# Patient Record
Sex: Female | Born: 1980
Health system: Southern US, Community
[De-identification: ages and names within clinical notes are randomized; demographics above are authoritative.]

## PROBLEM LIST (undated history)

## (undated) DIAGNOSIS — E079 Disorder of thyroid, unspecified: Secondary | ICD-10-CM

## (undated) DIAGNOSIS — Z8619 Personal history of other infectious and parasitic diseases: Secondary | ICD-10-CM

## (undated) DIAGNOSIS — T7840XA Allergy, unspecified, initial encounter: Secondary | ICD-10-CM

## (undated) DIAGNOSIS — F419 Anxiety disorder, unspecified: Secondary | ICD-10-CM

## (undated) DIAGNOSIS — J45909 Unspecified asthma, uncomplicated: Secondary | ICD-10-CM

## (undated) DIAGNOSIS — K219 Gastro-esophageal reflux disease without esophagitis: Secondary | ICD-10-CM

## (undated) DIAGNOSIS — N390 Urinary tract infection, site not specified: Secondary | ICD-10-CM

## (undated) HISTORY — DX: Urinary tract infection, site not specified: N39.0

## (undated) HISTORY — DX: Disorder of thyroid, unspecified: E07.9

## (undated) HISTORY — DX: Anxiety disorder, unspecified: F41.9

## (undated) HISTORY — DX: Allergy, unspecified, initial encounter: T78.40XA

## (undated) HISTORY — DX: Personal history of other infectious and parasitic diseases: Z86.19

## (undated) HISTORY — PX: TONSILLECTOMY: SUR1361

## (undated) HISTORY — DX: Gastro-esophageal reflux disease without esophagitis: K21.9

## (undated) HISTORY — DX: Unspecified asthma, uncomplicated: J45.909

---

## 2002-03-22 HISTORY — PX: BREAST SURGERY: SHX581

## 2005-03-22 HISTORY — PX: NECK SURGERY: SHX720

## 2010-05-05 ENCOUNTER — Ambulatory Visit (INDEPENDENT_AMBULATORY_CARE_PROVIDER_SITE_OTHER): Payer: BC Managed Care – PPO | Admitting: Family Medicine

## 2010-05-05 ENCOUNTER — Encounter: Payer: Self-pay | Admitting: Family Medicine

## 2010-05-05 DIAGNOSIS — J45990 Exercise induced bronchospasm: Secondary | ICD-10-CM | POA: Insufficient documentation

## 2010-05-05 DIAGNOSIS — Z23 Encounter for immunization: Secondary | ICD-10-CM

## 2010-05-05 DIAGNOSIS — L0390 Cellulitis, unspecified: Secondary | ICD-10-CM | POA: Insufficient documentation

## 2010-05-05 DIAGNOSIS — K219 Gastro-esophageal reflux disease without esophagitis: Secondary | ICD-10-CM | POA: Insufficient documentation

## 2010-05-05 DIAGNOSIS — IMO0002 Reserved for concepts with insufficient information to code with codable children: Secondary | ICD-10-CM | POA: Insufficient documentation

## 2010-05-05 DIAGNOSIS — F410 Panic disorder [episodic paroxysmal anxiety] without agoraphobia: Secondary | ICD-10-CM | POA: Insufficient documentation

## 2010-05-05 DIAGNOSIS — J301 Allergic rhinitis due to pollen: Secondary | ICD-10-CM | POA: Insufficient documentation

## 2010-05-05 DIAGNOSIS — L0291 Cutaneous abscess, unspecified: Secondary | ICD-10-CM | POA: Insufficient documentation

## 2010-05-05 DIAGNOSIS — N941 Unspecified dyspareunia: Secondary | ICD-10-CM | POA: Insufficient documentation

## 2010-05-12 ENCOUNTER — Other Ambulatory Visit: Payer: BC Managed Care – PPO

## 2010-05-13 NOTE — Assessment & Plan Note (Signed)
Summary: new pt to est jrt r/s from 04/30/10   Vital Signs:  Patient profile:   30 year old female Height:      64 inches Weight:      130 pounds BMI:     22.40 Temp:     97.9 degrees F oral Pulse rate:   71 / minute Pulse rhythm:   regular BP sitting:   96 / 72  (left arm) Cuff size:   regular  Vitals Entered By: Linde Gillis CMA Duncan Dull) (May 05, 2010 1:59 PM) CC: new patient, establish care   History of Present Illness: Here to establish.  Working at [employer].  Has not had a regualr MD in a while.. due for CPX.   GERD, diet controlled.  Typical pain during intercourse, worse near menses. In past some episodes of pelvic pain. None now. Left abdominal tenderness. Told once there was a concern for endometriosis. Had nml pelvic US. HAs had bad response to OCPs in the past.  Interested in having IUD.Marland Kitchen? interested in referral to GYN  Left hip tenderness  ...overuse from running.Malvin Johns ORHTO at [facility]. X-ray of hip nml.  Recommended  NSAIDs, rest and PT.   Pain is better but not reolved. Running on even surface. HAs some leg length discrepency.   Preventive Screening-Counseling & Management  Alcohol-Tobacco     Smoking Status: quit  Caffeine-Diet-Exercise     Does Patient Exercise: yes      Drug Use:  no.    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 Married No children. Former Smoker 3-4 pack year history Alcohol use-yes 1-2 drinks a night. Regular exercise-yes, running, weights, yoga Drug use-no Diet: Vegetarian.. eats a lot of protein  limits sweets Occupation:  employed Smoking Status:  quit Does Patient Exercise:  yes Drug Use:   no  Review of Systems General:  Complains of fatigue; mild fatigue.. family history of thyroid issues. CV:  Denies chest pain or discomfort. Resp:  Denies shortness of breath. GI:  Complains of abdominal pain; denies bloody stools, constipation, diarrhea, and indigestion. GU:  Denies abnormal vaginal bleeding, discharge, and dysuria. Derm:  Denies lesion(s). Psych:  Denies anxiety and depression.  Physical Exam  General:  thin appearing female IN NAD  Eyes:  No corneal or conjunctival inflammation noted. EOMI. Perrla. Funduscopic exam benign, without hemorrhages, exudates or papilledema. Vision grossly normal. Ears:  External ear exam shows no significant lesions or deformities.  Otoscopic examination reveals clear canals, tympanic membranes are intact bilaterally without bulging, retraction, inflammation or discharge. Hearing is grossly normal bilaterally. Nose:  External nasal examination shows no deformity or inflammation. Nasal mucosa are pink and moist without lesions or exudates. Mouth:  Oral mucosa and oropharynx without lesions or exudates.  Teeth in good repair. Neck:  no carotid bruit or thyromegaly no cervical or supraclavicular lymphadenopathy  Lungs:  Normal respiratory effort, chest expands symmetrically. Lungs are clear to auscultation, no crackles or wheezes. Heart:  Normal rate and regular rhythm. S1 and S2 normal without gallop, murmur, click, rub or other extra sounds. Abdomen:  Bowel sounds positive,abdomen soft and non-tender without masses, organomegaly or hernias noted. Msk:  No deformity or scoliosis noted of thoracic or lumbar spine.  Pulses:  R and L posterior tibial pulses are full and equal bilaterally  Extremities:  no edmea  Skin:  Intact without suspicious lesions or rashes Psych:  Cognition and judgment appear intact. Alert and cooperative with normal attention span and concentration. No apparent delusions, illusions, hallucinations   Impression &  Recommendations:  Problem # 1:  DYSPAREUNIA (ICD-625.0) Previous GYN, thought ? endometriosis. Nml [place] in past. No current pain. Pt also needs PAP and Breast ecam and interested in IUD.  Refer to GYN for above.  Orders: Gynecologic Referral (Gyn)  Problem # 2:  GERD (ICD-530.81) Well controlled with diet.   Complete Medication List: 1)  Proair Hfa 108 (90 Base) Mcg/act Aers (Albuterol sulfate) .... 2 puffs inhaled q 4-6 hours  as needed wheeze  Other Orders: Tdap => 74yrs IM (40981) Admin 1st Vaccine (19147)  Patient Instructions: 1)  Fasting lipids, CMET Dx v77.91, TSHDx 780.79 2)  Referral Appointment Information 3)  Day/Date: 4)  Time: 5)  Place/MD: 6)  Address: 7)  Phone/Fax: 8)  Patient given appointment information. Information/Orders faxed/mailed.   Orders Added: 1)  Tdap => 27yrs IM [90715] 2)  Admin 1st Vaccine [90471] 3)  Gynecologic Referral [Gyn] 4)  New Patient Level II [82956]   Immunizations Administered:  Tetanus Vaccine:    Vaccine Type: Tdap   Immunizations Administered:  Tetanus Vaccine:    Vaccine Type: Tdap  Prior Medications (reviewed today): None Current Allergies (reviewed today): No known allergies   Appended Document: new pt to est jrt r/s from 04/30/10     Allergies: No Known Drug Allergies   Complete Medication List: 1)  Proair Hfa 108 (90 Base) Mcg/act Aers (Albuterol sulfate) .... 2 puffs inhaled q 4-6 hours  as needed wheeze  Other Orders: Tdap => 56yrs IM (21308) Admin 1st Vaccine (65784)   Orders Added: 1)  Tdap => 71yrs IM [90715] 2)  Admin 1st Vaccine [69629]   Immunizations Administered:  Tetanus Vaccine:    Vaccine Type: Tdap    Site: left deltoid    Mfr: GlaxoSmithKline    Dose: 0.5 ml    Route: IM    Given by: Linde Gillis CMA (AAMA)    Exp. Date: 01/09/2012    Lot #: [id number]    VIS given: 02/07/08 version given May 05, 2010.   Immunizations Administered:  Tetanus Vaccine:    Vaccine  Type: Tdap    Site: left deltoid    Mfr: GlaxoSmithKline    Dose: 0.5 ml    Route: IM    Given by: Linde Gillis CMA (AAMA)    Exp. Date: 01/09/2012    Lot #: [id number]    VIS given: 02/07/08 version given May 05, 2010.

## 2010-05-14 ENCOUNTER — Other Ambulatory Visit (INDEPENDENT_AMBULATORY_CARE_PROVIDER_SITE_OTHER): Payer: BC Managed Care – PPO

## 2010-05-14 ENCOUNTER — Other Ambulatory Visit: Payer: Self-pay | Admitting: Family Medicine

## 2010-05-14 ENCOUNTER — Encounter (INDEPENDENT_AMBULATORY_CARE_PROVIDER_SITE_OTHER): Payer: Self-pay | Admitting: *Deleted

## 2010-05-14 DIAGNOSIS — R5383 Other fatigue: Secondary | ICD-10-CM

## 2010-05-14 DIAGNOSIS — Z13220 Encounter for screening for lipoid disorders: Secondary | ICD-10-CM

## 2010-05-14 DIAGNOSIS — R5381 Other malaise: Secondary | ICD-10-CM

## 2010-05-14 LAB — HEPATIC FUNCTION PANEL
ALT: 16 U/L (ref 0–35)
AST: 26 U/L (ref 0–37)
Alkaline Phosphatase: 44 U/L (ref 39–117)
Bilirubin, Direct: 0.1 mg/dL (ref 0.0–0.3)
Total Bilirubin: 0.6 mg/dL (ref 0.3–1.2)
Total Protein: 6.3 g/dL (ref 6.0–8.3)

## 2010-05-14 LAB — BASIC METABOLIC PANEL
CO2: 28 mEq/L (ref 19–32)
Chloride: 105 mEq/L (ref 96–112)
Creatinine, Ser: 0.7 mg/dL (ref 0.4–1.2)
Potassium: 4.2 mEq/L (ref 3.5–5.1)

## 2010-05-14 LAB — LIPID PANEL
LDL Cholesterol: 91 mg/dL (ref 0–99)
Total CHOL/HDL Ratio: 2
Triglycerides: 62 mg/dL (ref 0.0–149.0)

## 2010-06-02 ENCOUNTER — Ambulatory Visit: Payer: BC Managed Care – PPO | Admitting: Obstetrics and Gynecology

## 2010-06-02 DIAGNOSIS — Z1272 Encounter for screening for malignant neoplasm of vagina: Secondary | ICD-10-CM

## 2010-06-02 DIAGNOSIS — Z01419 Encounter for gynecological examination (general) (routine) without abnormal findings: Secondary | ICD-10-CM

## 2010-06-25 NOTE — Assessment & Plan Note (Signed)
NAME:  Tonya Carrillo, Tonya Carrillo NO.:  [id number]  MEDICAL RECORD NO.:  [id number]           PATIENT TYPE:  LOCATION:  [facility]           FACILITY:  PHYSICIAN:  Allie Bossier, MD             DATE OF BIRTH:  DATE OF SERVICE:  06/02/2010                                 CLINIC NOTE  HISTORY OF PRESENT ILLNESS:  Female is a 30 year old married white gravida 0 who is interested in long-term birth control.  She is "99% sure" that she wants no children, but she is not ready to commit to a tubal.  She has no GYN complaints.  PAST MEDICAL HISTORY:  She has a history of MRSA.  REVIEW OF SYSTEMS:  She uses condoms currently for birth control.  She tried OCPs (Ortho-Tri-Cyclen Lo), but she ended up having panic attacks for several months after a while and slightly after being on Ortho Tri- Cyclen Lo, so she is adamant that she wants no systemic hormonal birth control.  Her periods are monthly and they last 3-5 days.  She has been married for last 2-1/2 years.  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  She is a strict vegetarian.  PAST SURGICAL HISTORY:  Tonsillectomy, wisdom teeth extraction, sebaceous cyst removed from her breast (right breast) and another from her neck.  FAMILY HISTORY:  Positive for breast cancer in her maternal grandmother who was diagnosed in her late 91s.  Her mother has hypothyroidism.  She denies GYN or colon malignancies in her family.  ALLERGIES:  No known drug allergies.  No latex allergies.  SOCIAL HISTORY:  She drinks on a social basis.  She quit smoking in 2009 and prior to that had a 12-pack-year history of smoking.  She denies any drug use.  MEDICATIONS:  None.  PHYSICAL EXAMINATION:  GENERAL:  Well-nourished, well-hydrated very pleasant white female. VITAL SIGNS:  Height 5 feet 4-1/2 inches, weight 134 pounds, blood pressure 114/70, pulse 56. HEENT:  Normal breast exam.  Normal bilaterally.  No nipple  discharge, skin changes, or masses. ABDOMEN:  No palpable hepatosplenomegaly. EXTERNAL GENITALIA:  No lesions.  Cervix tiny and nulliparous.  Uterus normal size and shape, anteverted, midplane, mobile.  Adnexa nontender. No masses.  ASSESSMENT/PLAN: 1. Annual exam.  I have checked Pap smear.  Recommended self-breast     and self-vulvar exams monthly. 2. Birth control.  I have discussed long-term options including the     risks and benefits of Mirena and ParaGard.  I have also discussed     hysteroscopic tubal ligation (Essure) as well as vasectomy.  She     will do further research     and contemplation and will schedule point when she makes a decision     about this.  Until then, she will use condoms.     Allie Bossier, MD    MCD/MEDQ  D:  06/02/2010  T:  06/03/2010  Job:  [id number]

## 2010-06-29 ENCOUNTER — Encounter: Payer: Self-pay | Admitting: Family Medicine

## 2010-06-30 ENCOUNTER — Encounter: Payer: Self-pay | Admitting: Family Medicine

## 2010-06-30 ENCOUNTER — Ambulatory Visit (INDEPENDENT_AMBULATORY_CARE_PROVIDER_SITE_OTHER): Payer: Self-pay | Admitting: Family Medicine

## 2010-06-30 VITALS — BP 118/80 | HR 54 | Temp 98.5°F | Ht 64.5 in | Wt 134.0 lb

## 2010-06-30 DIAGNOSIS — N390 Urinary tract infection, site not specified: Secondary | ICD-10-CM

## 2010-06-30 DIAGNOSIS — R300 Dysuria: Secondary | ICD-10-CM

## 2010-06-30 LAB — POCT URINALYSIS DIPSTICK
Bilirubin, UA: NEGATIVE
Blood, UA: NEGATIVE
Ketones, UA: NEGATIVE
Protein, UA: NEGATIVE
Spec Grav, UA: 1
pH, UA: 6.5

## 2010-06-30 MED ORDER — NITROFURANTOIN MONOHYD MACRO 100 MG PO CAPS: 100.0000 mg | ORAL_CAPSULE | Freq: Two times a day (BID) | ORAL | Status: AC

## 2010-06-30 MED ORDER — NITROFURANTOIN MONOHYD MACRO 100 MG PO CAPS: 100.0000 mg | ORAL_CAPSULE | Freq: Two times a day (BID) | ORAL | Status: DC

## 2010-06-30 NOTE — Progress Notes (Signed)
30 year old female:  A little blood on the tissue.  LMP 06/19/2010  Overnight got up a lot  Burned some.   Patient presents with burning, urgency. No vaginal discharge or external irritation.  No STD exposure. No abd pain, no flank pain.  ROS: GEN:  no fevers, chills. GI: No n/v/d, eating normally Otherwise, ROS is as per the HPI.  GEN: WDWN, A&Ox4,NAD. Non-toxic HEENT: Atraumatc, normocephalic. CV: RRR, No M/G/R PULM: CTA B, No wheezes, crackles, or rhonchi ABD: S, NT, ND, +BS, no rebound. No CVAT. No suprapubic tenderness. EXT: No c/c/e  A/P: UTI. Rx with ABX as below - probable. H/o bladder stones, may have been a passing stone. Hold abx for a few days to see if sx clear

## 2011-07-09 ENCOUNTER — Ambulatory Visit (INDEPENDENT_AMBULATORY_CARE_PROVIDER_SITE_OTHER): Payer: BC Managed Care – PPO | Admitting: Family Medicine

## 2011-07-09 ENCOUNTER — Encounter: Payer: Self-pay | Admitting: Family Medicine

## 2011-07-09 VITALS — BP 100/60 | HR 85 | Temp 98.2°F | Ht 64.5 in | Wt 136.0 lb

## 2011-07-09 DIAGNOSIS — J069 Acute upper respiratory infection, unspecified: Secondary | ICD-10-CM

## 2011-07-09 DIAGNOSIS — J029 Acute pharyngitis, unspecified: Secondary | ICD-10-CM

## 2011-07-09 DIAGNOSIS — B309 Viral conjunctivitis, unspecified: Secondary | ICD-10-CM

## 2011-07-09 NOTE — Progress Notes (Signed)
Subjective:    Patient ID: Tonya Carrillo, female    DOB: 1980-09-29, 31 y.o.   MRN: [id number]  HPI Here for st and conjunctivitis  Run down and tired with stress lately Taking claritin for allergies   Woke up wed with swollen feeling throat - hard to swallow and sore  Thought it was allergies  As day went on -got chills and felt feverish  By ThursdayLinden Dolin - felt like the flu  Did not take her temp   Then gunk in her L eye -- lots of cloudy/ yellow discharge (took out her contacts)  Eye is mildly itchy overall   Using saline drops in eye   Rapid strep neg today   Is having a little runny and stuffy nose   Patient Active Problem List  Diagnoses  . PANIC ATTACK  . ALLERGIC RHINITIS, SEASONAL  . ASTHMA, EXERCISE INDUCED  . GERD  . DYSPAREUNIA  . OTHER MALAISE AND FATIGUE  . UTI (urinary tract infection)  . Viral URI  . Conjunctivitis, viral   No past medical history on file. Past Surgical History  Procedure Date  . Tonsillectomy    History  Substance Use Topics  . Smoking status: Former Smoker -- 1.0 packs/day for 3 years    Types: Cigarettes  . Smokeless tobacco: Not on file  . Alcohol Use: Yes   Family History  Problem Relation Age of Onset  . Stroke Mother   . Hypertension Mother   . Hyperlipidemia Mother   . Thyroid disease Mother   . Cancer Father     multiple myelomas  . Obesity Brother   . Cancer Maternal Grandmother     breast  . Cancer Maternal Grandfather 57    colon   . ALS Paternal Grandfather    No Known Allergies Current Outpatient Prescriptions on File Prior to Visit  Medication Sig Dispense Refill  . albuterol (PROAIR HFA) 108 (90 BASE) MCG/ACT inhaler Inhale 2 puffs into the lungs every 6 (six) hours as needed.               Review of Systems Review of Systems  Constitutional: Negative for, appetite change,  and unexpected weight change.  Eyes: Negative for pain and visual disturbance. pos for redness of eye and  itching ENt pos for runny and stuffy nose with post nasal drip , neg for sinus pain  Respiratory: Negative forsob or wheeze   Cardiovascular: Negative for cp or palpitations    Gastrointestinal: Negative for nausea, diarrhea and constipation.  Genitourinary: Negative for urgency and frequency.  Skin: Negative for pallor or rash   Neurological: Negative for weakness, light-headedness, numbness and headaches.  Hematological: Negative for adenopathy. Does not bruise/bleed easily.  Psychiatric/Behavioral: Negative for dysphoric mood. The patient is not nervous/anxious.          Objective:   Physical Exam  Constitutional: She appears well-nourished. No distress.  HENT:  Head: Normocephalic and atraumatic.  Right Ear: External ear normal.  Left Ear: External ear normal.  Mouth/Throat: Oropharynx is clear and moist. No oropharyngeal exudate.       Nares are injected and congested  No sinus tenderness L eye - is injected with some clear tearing , no s/s of trauma or fb seen  Eyes: EOM are normal. Pupils are equal, round, and reactive to light. Right eye exhibits no discharge. Left eye exhibits discharge. No scleral icterus.  Neck: Normal range of motion. Neck supple.  Cardiovascular: Normal rate, regular rhythm and normal  heart sounds.   No murmur heard. Pulmonary/Chest: Effort normal and breath sounds normal. No respiratory distress. She has no wheezes. She has no rales.  Lymphadenopathy:    She has no cervical adenopathy.  Neurological: She is alert.  Skin: Skin is warm and dry. No rash noted. No erythema.  Psychiatric: She has a normal mood and affect.          Assessment & Plan:

## 2011-07-09 NOTE — Patient Instructions (Signed)
Drink lots of fluids Tylenol for fever or throat pain or ibuprofen  mucinex for congestion or cough is helpful  Use artificial tears for eye - wipe with cool clean cloth for discharge , cool compresses as needed  No contacts until completely better  claritin ok for runny nose  Update if not starting to improve in a week or if worsening   Update me if eye gets much worse quickly - red or painful or vision change  Wash all linens frequently

## 2011-07-11 NOTE — Assessment & Plan Note (Signed)
With congestion and L conjunctivitis Disc symptomatic care - see instructions on AVS  Update if not starting to improve in a week or if worsening

## 2011-07-11 NOTE — Assessment & Plan Note (Signed)
Mild in L eye  Assoc with viral uri  Disc symptomatic care - see instructions on AVS  Disc imp of hygiene  Update if not starting to improve in a week or if worsening

## 2011-09-01 ENCOUNTER — Encounter: Payer: Self-pay | Admitting: Family Medicine

## 2011-09-01 ENCOUNTER — Ambulatory Visit (INDEPENDENT_AMBULATORY_CARE_PROVIDER_SITE_OTHER): Payer: BC Managed Care – PPO | Admitting: Family Medicine

## 2011-09-01 VITALS — BP 98/64 | HR 56 | Temp 98.2°F | Ht 64.5 in | Wt 137.8 lb

## 2011-09-01 DIAGNOSIS — R5381 Other malaise: Secondary | ICD-10-CM

## 2011-09-01 DIAGNOSIS — D179 Benign lipomatous neoplasm, unspecified: Secondary | ICD-10-CM

## 2011-09-01 DIAGNOSIS — R5383 Other fatigue: Secondary | ICD-10-CM | POA: Insufficient documentation

## 2011-09-01 LAB — CBC WITH DIFFERENTIAL/PLATELET
Eosinophils Relative: 2.8 % (ref 0.0–5.0)
HCT: 41.7 % (ref 36.0–46.0)
Hemoglobin: 14 g/dL (ref 12.0–15.0)
Lymphs Abs: 2.1 10*3/uL (ref 0.7–4.0)
Monocytes Relative: 10.6 % (ref 3.0–12.0)
Neutro Abs: 3.4 10*3/uL (ref 1.4–7.7)
WBC: 6.3 10*3/uL (ref 4.5–10.5)

## 2011-09-01 LAB — COMPREHENSIVE METABOLIC PANEL
CO2: 27 mEq/L (ref 19–32)
Creatinine, Ser: 0.8 mg/dL (ref 0.4–1.2)
GFR: 95.02 mL/min (ref >60.00)
Glucose, Bld: 83 mg/dL (ref 70–99)
Total Bilirubin: 0.3 mg/dL (ref 0.3–1.2)

## 2011-09-01 LAB — POCT URINE PREGNANCY: Preg Test, Ur: NEGATIVE

## 2011-09-01 LAB — TSH: TSH: 1.73 u[IU]/mL (ref 0.35–5.50)

## 2011-09-01 NOTE — Patient Instructions (Addendum)
Labs today for fatigue  Keep up the good diet and exercise and sleep habits I think the area on buttocks is a lipoma - watch it for growth or pain and let me know if any change at all  preg test today also

## 2011-09-01 NOTE — Progress Notes (Signed)
Subjective:    Patient ID: Tonya Carrillo, female    DOB: 06/15/1980, 31 y.o.   MRN: [id number]  HPI Here for fatigue and lump R buttocks   Has had a stressful academic year - thought that was causing fatigue  Is worse now after being out for 3 weeks  Sleeps ok  Exercises/ gets enough sleep/ good health habits  Is having more difficulty loosing weight - even though she tracks everything and exercises  Instead is gaining  bmi is 23  Weird tingling in arms and leg -- fleeting at times- no pain or other symptoms  Does have a hx of panic attacks once with OCs - and that was purely caused by the pill Better now  Has good insight of mood -- always fluctuates after school year   Right now is reading and writing  Teaches classics - English   Periods - a little irreg - April and may were a bit late - and heavier Also pain mid cycle  Due for menses now  Doubts pregnant - uses condoms   Has a lump under skin R buttock  Does not hurt- no red / itchy or draining  ? A lipoma  Is a vegetarian  Lots of dark green vegetables   Patient Active Problem List  Diagnosis  . PANIC ATTACK  . ALLERGIC RHINITIS, SEASONAL  . ASTHMA, EXERCISE INDUCED  . GERD  . DYSPAREUNIA  . OTHER MALAISE AND FATIGUE  . UTI (urinary tract infection)  . Viral URI  . Conjunctivitis, viral   No past medical history on file. Past Surgical History  Procedure Date  . Tonsillectomy    History  Substance Use Topics  . Smoking status: Former Smoker -- 1.0 packs/day for 3 years    Types: Cigarettes  . Smokeless tobacco: Never Used  . Alcohol Use: Yes     wine or beer nightly   Family History  Problem Relation Age of Onset  . Stroke Mother   . Hypertension Mother   . Hyperlipidemia Mother   . Thyroid disease Mother   . Cancer Father     multiple myelomas  . Obesity Brother   . Cancer Maternal Grandmother     breast  . Cancer Maternal Grandfather 55    colon   . ALS Paternal Grandfather    No  Known Allergies Current Outpatient Prescriptions on File Prior to Visit  Medication Sig Dispense Refill  . albuterol (PROAIR HFA) 108 (90 BASE) MCG/ACT inhaler Inhale 2 puffs into the lungs every 6 (six) hours as needed.        . loratadine (CLARITIN) 10 MG tablet Take 10 mg by mouth daily as needed.              Review of Systems Review of Systems  Constitutional: Negative for fever, appetite change, and unexpected weight change.  Eyes: Negative for pain and visual disturbance.  Respiratory: Negative for cough and shortness of breath.   Cardiovascular: Negative for cp or palpitations    Gastrointestinal: Negative for nausea, diarrhea and constipation.  Genitourinary: Negative for urgency and frequency.  Skin: Negative for pallor or rash   Neurological: Negative for weakness, light-headedness, numbness and headaches.  Hematological: Negative for adenopathy. Does not bruise/bleed easily.  Psychiatric/Behavioral: Negative for dysphoric mood.anxiety is improved with less stress       Objective:   Physical Exam  Constitutional: She appears well-developed and well-nourished. No distress.  HENT:  Head: Normocephalic and atraumatic.  Right Ear: External  ear normal.  Left Ear: External ear normal.  Nose: Nose normal.  Mouth/Throat: Oropharynx is clear and moist.  Eyes: Conjunctivae and EOM are normal. No scleral icterus.  Neck: Normal range of motion. Neck supple. No JVD present. No thyromegaly present.  Cardiovascular: Normal rate, regular rhythm, normal heart sounds and intact distal pulses.   Pulmonary/Chest: Breath sounds normal. No respiratory distress. She has no wheezes.  Abdominal: Soft. Bowel sounds are normal. She exhibits no distension and no mass. There is no tenderness.  Musculoskeletal: Normal range of motion. She exhibits no edema and no tenderness.  Lymphadenopathy:    She has no cervical adenopathy.  Neurological: She is alert. She has normal reflexes. No cranial  nerve deficit. She exhibits normal muscle tone. Coordination normal.  Skin: Skin is warm and dry. No rash noted. No erythema. No pallor.       R buttock has superficial mobile fatty lump that is non tender with no skin change   Psychiatric: She has a normal mood and affect.       Not anxious but seem stressed           Assessment & Plan:  l

## 2011-09-04 NOTE — Assessment & Plan Note (Signed)
With recent stressors No other physical symptoms  Lab today and update  Good health and self care habits discussed No signs of depression - but will be on the look out for that

## 2011-09-04 NOTE — Assessment & Plan Note (Signed)
On R buttock with no pain or other symptoms Will observe this for growth or other change Consider surgical ref if change occurs

## 2011-12-28 ENCOUNTER — Encounter: Payer: Self-pay | Admitting: Family Medicine

## 2011-12-28 ENCOUNTER — Ambulatory Visit (INDEPENDENT_AMBULATORY_CARE_PROVIDER_SITE_OTHER): Payer: BC Managed Care – PPO | Admitting: Family Medicine

## 2011-12-28 VITALS — BP 105/69 | HR 59 | Ht 64.5 in | Wt 124.1 lb

## 2011-12-28 DIAGNOSIS — S3141XA Laceration without foreign body of vagina and vulva, initial encounter: Secondary | ICD-10-CM

## 2011-12-28 DIAGNOSIS — S3140XA Unspecified open wound of vagina and vulva, initial encounter: Secondary | ICD-10-CM

## 2011-12-28 NOTE — Progress Notes (Signed)
  Subjective:    Patient ID: Tonya Carrillo, female    DOB: 24-May-1980, 31 y.o.   MRN: [id number]  HPI  Pt. Is here for bleeding following fingers with sexual activity.  LNMP was 9/16.  Bleeding lasted one day and was bright red.  Thinks it could be a laceration. New partner.  Uses condoms.  No vaginal discharge.  Review of Systems  Constitutional: Negative for fever and fatigue.  Gastrointestinal: Negative for abdominal pain.  Genitourinary: Negative for dysuria and vaginal discharge.       Objective:   Physical Exam  Genitourinary: There is no tenderness or lesion on the right labia. There is no tenderness or lesion on the left labia. Uterus is not tender. Cervix exhibits no motion tenderness and no friability. Right adnexum displays no mass and no tenderness. Left adnexum displays no mass and no tenderness. There are signs of injury (left side wall) around the vagina. No vaginal discharge found.          Assessment & Plan:  Vaginal laceration--should heal with time.  Avoid anything per vagina for a few days.

## 2011-12-28 NOTE — Patient Instructions (Signed)
Vaginal Laceration  You have a laceration of the vagina. A laceration is a cut or tear. Small tears usually do not need stitches. Your caregiver may have to repair the tear with stitches for you. This brings the skin edges (margins) together and allows the cut to heal faster. Your caregiver will instruct you as to whether stitch (suture) removal is necessary. Some sutures are absorbed by the body without removal. Some vaginal lacerations can be very serious because of heavy bleeding. Therefore, medical evaluation is necessary right away.  CAUSES    A controlled cutting of the vagina when delivering a baby (episiotomy).   Delivering a baby without an episiotomy.   Trauma caused by a bicycle or car accident.   Domestic violence and/or rape.  HOME CARE INSTRUCTIONS    Only take over-the-counter or prescription medicines for pain, discomfort, or fever as directed by your caregiver. Do not use aspirin because it can increase bleeding problems.   Your caregiver may prescribe medicines that kill germs (antibiotics) or recommend a tetanus shot.   You may resume normal diet and activities as directed.   Do not douche, use tampons or have intercourse until your caregiver has given permission.   A bandage (dressing) may have been applied. This may be changed once per day or as instructed. If the dressing sticks, it may be soaked off with soapy water or hydrogen peroxide.   Warm sitz baths 2 to 3 times a day may help any discomfort and swelling of the laceration.  You might need a tetanus shot now if:   You have no idea when you had the last one.   You have never had a tetanus shot before.   Your cut had dirt in it.  If you need a tetanus shot, and you decide not to get one, there is a rare chance of getting tetanus. Sickness from tetanus can be serious. If you got a tetanus shot, your arm may swell, get red and warm to the touch at the shot site. This is common and not a problem.  SEEK MEDICAL CARE IF:    You  develop a rash.   You have problems with the medications.  SEEK IMMEDIATE MEDICAL CARE IF:    There is redness, swelling, or increasing pain in the vaginal area.   Pus is coming from the wound or vagina.   An unexplained oral temperature above 102 F (38.9 C) develops.   You notice a bad smell coming from the vagina. This may be a sign of infection.   There is a breaking open of a wound (edges not staying together).   You develop increasing belly (abdominal) pain.   There is a lot of (excessive) vaginal bleeding.   There is pain with intercourse after the laceration heals. Scar tissue may have developed.  Document Released: 03/08/2005 Document Revised: 05/31/2011 Document Reviewed: 07/26/2011  [employer] Patient Information 2013 [employer]

## 2011-12-28 NOTE — Progress Notes (Signed)
Pelvic pain, worse at night when lying down.  Did have some vaginal bleeding 10/3-4.

## 2013-02-28 ENCOUNTER — Ambulatory Visit (INDEPENDENT_AMBULATORY_CARE_PROVIDER_SITE_OTHER): Payer: BC Managed Care – PPO | Admitting: Obstetrics & Gynecology

## 2013-02-28 ENCOUNTER — Encounter: Payer: Self-pay | Admitting: Obstetrics & Gynecology

## 2013-02-28 VITALS — BP 125/94 | HR 59 | Ht 64.0 in | Wt 122.0 lb

## 2013-02-28 DIAGNOSIS — Z113 Encounter for screening for infections with a predominantly sexual mode of transmission: Secondary | ICD-10-CM

## 2013-02-28 DIAGNOSIS — Z1151 Encounter for screening for human papillomavirus (HPV): Secondary | ICD-10-CM

## 2013-02-28 DIAGNOSIS — J45909 Unspecified asthma, uncomplicated: Secondary | ICD-10-CM

## 2013-02-28 DIAGNOSIS — Z01419 Encounter for gynecological examination (general) (routine) without abnormal findings: Secondary | ICD-10-CM

## 2013-02-28 DIAGNOSIS — F410 Panic disorder [episodic paroxysmal anxiety] without agoraphobia: Secondary | ICD-10-CM

## 2013-02-28 DIAGNOSIS — Z124 Encounter for screening for malignant neoplasm of cervix: Secondary | ICD-10-CM

## 2013-02-28 MED ORDER — LORAZEPAM 0.5 MG PO TABS: 0.2500 mg | ORAL_TABLET | Freq: Two times a day (BID) | ORAL | Status: DC | PRN

## 2013-02-28 MED ORDER — ALBUTEROL SULFATE HFA 108 (90 BASE) MCG/ACT IN AERS: 2.0000 | INHALATION_SPRAY | Freq: Four times a day (QID) | RESPIRATORY_TRACT | Status: DC | PRN

## 2013-02-28 NOTE — Progress Notes (Signed)
  Subjective:     Tonya Carrillo is a 32 y.o. G42P1001 female and is here for a comprehensive physical exam. The patient reports no problems. Desires STI screen.  History   Social History  . Marital Status: Married    Spouse Name: N/A    Number of Children: 0  . Years of Education: N/A   Occupational History  . [employer] professor    Social History Main Topics  . Smoking status: Former Smoker -- 1.00 packs/day for 3 years    Types: Cigarettes  . Smokeless tobacco: Never Used  . Alcohol Use: Yes     Comment: wine or beer nightly  . Drug Use: No  . Sexual Activity: Yes    Birth Control/ Protection: Condom   Other Topics Concern  . Not on file   Social History Narrative   Diet Vegetarian.. Eats a lot of protein limits sweets      Regular exercise running, weights, yoga   Health Maintenance  Topic Date Due  . Pap Smear  05/28/1997  . Influenza Vaccine  10/20/2012  . Tetanus/tdap  05/05/2020    The following portions of the patient's history were reviewed and updated as appropriate: allergies, current medications, past family history, past medical history, past social history, past surgical history and problem list.  Review of Systems A comprehensive review of systems was negative.   Objective:   BP 125/94  Pulse 59  Ht 5\' 4"  (1.626 m)  Wt 122 lb (55.339 kg)  BMI 20.93 kg/m2  LMP 02/20/2013 GENERAL: Well-developed, well-nourished female in no acute distress.  HEENT: Normocephalic, atraumatic. Sclerae anicteric.  NECK: Supple. Normal thyroid.  LUNGS: Clear to auscultation bilaterally.  HEART: Regular rate and rhythm. BREASTS: Symmetric in size. No masses, skin changes, nipple drainage, or lymphadenopathy. ABDOMEN: Soft, nontender, nondistended. No organomegaly. PELVIC: Normal external female genitalia. Vagina is pink and rugated.  Normal discharge. Normal cervix contour. Pap smear obtained. Uterus is normal in size. No adnexal mass or tenderness.  EXTREMITIES: No  cyanosis, clubbing, or edema, 2+ distal pulses.   Assessment:    Healthy female exam. STI screen   Plan:   Pap done, will follow up results and manage accordingly. STI screen checked; will follow up results and manage accordingly Routine preventative health maintenance measures emphasized  Jaynie Collins, MD, FACOG Attending Obstetrician & Gynecologist Faculty Practice, [facility]

## 2013-02-28 NOTE — Patient Instructions (Signed)
Preventive Care for Adults, Female A healthy lifestyle and preventive care can promote health and wellness. Preventive health guidelines for women include the following key practices.  A routine yearly physical is a good way to check with your caregiver about your health and preventive screening. It is a chance to share any concerns and updates on your health, and to receive a thorough exam.  Visit your dentist for a routine exam and preventive care every 6 months. Brush your teeth twice a day and floss once a day. Good oral hygiene prevents tooth decay and gum disease.  The frequency of eye exams is based on your age, health, family medical history, use of contact lenses, and other factors. Follow your caregiver's recommendations for frequency of eye exams.  Eat a healthy diet. Foods like vegetables, fruits, whole grains, low-fat dairy products, and lean protein foods contain the nutrients you need without too many calories. Decrease your intake of foods high in solid fats, added sugars, and salt. Eat the right amount of calories for you.Get information about a proper diet from your caregiver, if necessary.  Regular physical exercise is one of the most important things you can do for your health. Most adults should get at least 150 minutes of moderate-intensity exercise (any activity that increases your heart rate and causes you to sweat) each week. In addition, most adults need muscle-strengthening exercises on 2 or more days a week.  Maintain a healthy weight. The body mass index (BMI) is a screening tool to identify possible weight problems. It provides an estimate of body fat based on height and weight. Your caregiver can help determine your BMI, and can help you achieve or maintain a healthy weight.For adults 20 years and older:  A BMI below 18.5 is considered underweight.  A BMI of 18.5 to 24.9 is normal.  A BMI of 25 to 29.9 is considered overweight.  A BMI of 30 and above is  considered obese.  Maintain normal blood lipids and cholesterol levels by exercising and minimizing your intake of saturated fat. Eat a balanced diet with plenty of fruit and vegetables. Blood tests for lipids and cholesterol should begin at age 41 and be repeated every 5 years. If your lipid or cholesterol levels are high, you are over 50, or you are at high risk for heart disease, you may need your cholesterol levels checked more frequently.Ongoing high lipid and cholesterol levels should be treated with medicines if diet and exercise are not effective.  If you smoke, find out from your caregiver how to quit. If you do not use tobacco, do not start.  Lung cancer screening is recommended for adults aged 28 80 years who are at high risk for developing lung cancer because of a history of smoking. Yearly low-dose computed tomography (CT) is recommended for people who have at least a 30-pack-year history of smoking and are a current smoker or have quit within the past 15 years. A pack year of smoking is smoking an average of 1 pack of cigarettes a day for 1 year (for example: 1 pack a day for 30 years or 2 packs a day for 15 years). Yearly screening should continue until the smoker has stopped smoking for at least 15 years. Yearly screening should also be stopped for people who develop a health problem that would prevent them from having lung cancer treatment.  If you are pregnant, do not drink alcohol. If you are breastfeeding, be very cautious about drinking alcohol. If you are  not pregnant and choose to drink alcohol, do not exceed 1 drink per day. One drink is considered to be 12 ounces (355 mL) of beer, 5 ounces (148 mL) of wine, or 1.5 ounces (44 mL) of liquor.  Avoid use of street drugs. Do not share needles with anyone. Ask for help if you need support or instructions about stopping the use of drugs.  High blood pressure causes heart disease and increases the risk of stroke. Your blood pressure  should be checked at least every 1 to 2 years. Ongoing high blood pressure should be treated with medicines if weight loss and exercise are not effective.  If you are 55 to 33 years old, ask your caregiver if you should take aspirin to prevent strokes.  Diabetes screening involves taking a blood sample to check your fasting blood sugar level. This should be done once every 3 years, after age 45, if you are within normal weight and without risk factors for diabetes. Testing should be considered at a younger age or be carried out more frequently if you are overweight and have at least 1 risk factor for diabetes.  Breast cancer screening is essential preventive care for women. You should practice "breast self-awareness." This means understanding the normal appearance and feel of your breasts and may include breast self-examination. Any changes detected, no matter how small, should be reported to a caregiver. Women in their 20s and 30s should have a clinical breast exam (CBE) by a caregiver as part of a regular health exam every 1 to 3 years. After age 40, women should have a CBE every year. Starting at age 40, women should consider having a mammography (breast X-ray test) every year. Women who have a family history of breast cancer should talk to their caregiver about genetic screening. Women at a high risk of breast cancer should talk to their caregivers about having magnetic resonance imaging (MRI) and a mammography every year.  Breast cancer gene (BRCA)-related cancer risk assessment is recommended for women who have family members with BRCA-related cancers. BRCA-related cancers include breast, ovarian, tubal, and peritoneal cancers. Having family members with these cancers may be associated with an increased risk for harmful changes (mutations) in the breast cancer genes BRCA1 and BRCA2. Results of the assessment will determine the need for genetic counseling and BRCA1 and BRCA2 testing.  The Pap test is  a screening test for cervical cancer. A Pap test can show cell changes on the cervix that might become cervical cancer if left untreated. A Pap test is a procedure in which cells are obtained and examined from the lower end of the uterus (cervix).  Women should have a Pap test starting at age 21.  Between ages 21 and 29, Pap tests should be repeated every 2 years.  Beginning at age 30, you should have a Pap test every 3 years as long as the past 3 Pap tests have been normal.  Some women have medical problems that increase the chance of getting cervical cancer. Talk to your caregiver about these problems. It is especially important to talk to your caregiver if a new problem develops soon after your last Pap test. In these cases, your caregiver may recommend more frequent screening and Pap tests.  The above recommendations are the same for women who have or have not gotten the vaccine for human papillomavirus (HPV).  If you had a hysterectomy for a problem that was not cancer or a condition that could lead to cancer, then   you no longer need Pap tests. Even if you no longer need a Pap test, a regular exam is a good idea to make sure no other problems are starting.  If you are between ages 79 and 19, and you have had normal Pap tests going back 10 years, you no longer need Pap tests. Even if you no longer need a Pap test, a regular exam is a good idea to make sure no other problems are starting.  If you have had past treatment for cervical cancer or a condition that could lead to cancer, you need Pap tests and screening for cancer for at least 20 years after your treatment.  If Pap tests have been discontinued, risk factors (such as a new sexual partner) need to be reassessed to determine if screening should be resumed.  The HPV test is an additional test that may be used for cervical cancer screening. The HPV test looks for the virus that can cause the cell changes on the cervix. The cells collected  during the Pap test can be tested for HPV. The HPV test could be used to screen women aged 90 years and older, and should be used in women of any age who have unclear Pap test results. After the age of 9, women should have HPV testing at the same frequency as a Pap test.  Colorectal cancer can be detected and often prevented. Most routine colorectal cancer screening begins at the age of 63 and continues through age 61. However, your caregiver may recommend screening at an earlier age if you have risk factors for colon cancer. On a yearly basis, your caregiver may provide home test kits to check for hidden blood in the stool. Use of a small camera at the end of a tube, to directly examine the colon (sigmoidoscopy or colonoscopy), can detect the earliest forms of colorectal cancer. Talk to your caregiver about this at age 32, when routine screening begins. Direct examination of the colon should be repeated every 5 to 10 years through age 7, unless early forms of pre-cancerous polyps or small growths are found.  Hepatitis C blood testing is recommended for all people born from [birth year] through 1965 and any individual with known risks for hepatitis C.  Practice safe sex. Use condoms and avoid high-risk sexual practices to reduce the spread of sexually transmitted infections (STIs). STIs include gonorrhea, chlamydia, syphilis, trichomonas, herpes, HPV, and human immunodeficiency virus (HIV). Herpes, HIV, and HPV are viral illnesses that have no cure. They can result in disability, cancer, and death. Sexually active women aged 7 and younger should be checked for chlamydia. Older women with new or multiple partners should also be tested for chlamydia. Testing for other STIs is recommended if you are sexually active and at increased risk.  Osteoporosis is a disease in which the bones lose minerals and strength with aging. This can result in serious bone fractures. The risk of osteoporosis can be identified using a  bone density scan. Women ages 30 and over and women at risk for fractures or osteoporosis should discuss screening with their caregivers. Ask your caregiver whether you should take a calcium supplement or vitamin D to reduce the rate of osteoporosis.  Menopause can be associated with physical symptoms and risks. Hormone replacement therapy is available to decrease symptoms and risks. You should talk to your caregiver about whether hormone replacement therapy is right for you.  Use sunscreen. Apply sunscreen liberally and repeatedly throughout the day. You should seek shade  when your shadow is shorter than you. Protect yourself by wearing long sleeves, pants, a wide-brimmed hat, and sunglasses year round, whenever you are outdoors.  Once a month, do a whole body skin exam, using a mirror to look at the skin on your back. Notify your caregiver of new moles, moles that have irregular borders, moles that are larger than a pencil eraser, or moles that have changed in shape or color.  Stay current with required immunizations.  Influenza vaccine. All adults should be immunized every year.  Tetanus, diphtheria, and acellular pertussis (Td, Tdap) vaccine. Pregnant women should receive 1 dose of Tdap vaccine during each pregnancy. The dose should be obtained regardless of the length of time since the last dose. Immunization is preferred during the 27th to 36th week of gestation. An adult who has not previously received Tdap or who does not know her vaccine status should receive 1 dose of Tdap. This initial dose should be followed by tetanus and diphtheria toxoids (Td) booster doses every 10 years. Adults with an unknown or incomplete history of completing a 3-dose immunization series with Td-containing vaccines should begin or complete a primary immunization series including a Tdap dose. Adults should receive a Td booster every 10 years.  Varicella vaccine. An adult without evidence of immunity to varicella  should receive 2 doses or a second dose if she has previously received 1 dose. Pregnant females who do not have evidence of immunity should receive the first dose after pregnancy. This first dose should be obtained before leaving the health care facility. The second dose should be obtained 4 8 weeks after the first dose.  Human papillomavirus (HPV) vaccine. Females aged 13 26 years who have not received the vaccine previously should obtain the 3-dose series. The vaccine is not recommended for use in pregnant females. However, pregnancy testing is not needed before receiving a dose. If a female is found to be pregnant after receiving a dose, no treatment is needed. In that case, the remaining doses should be delayed until after the pregnancy. Immunization is recommended for any person with an immunocompromised condition through the age of 26 years if she did not get any or all doses earlier. During the 3-dose series, the second dose should be obtained 4 8 weeks after the first dose. The third dose should be obtained 24 weeks after the first dose and 16 weeks after the second dose.  Zoster vaccine. One dose is recommended for adults aged 60 years or older unless certain conditions are present.  Measles, mumps, and rubella (MMR) vaccine. Adults born before 1957 generally are considered immune to measles and mumps. Adults born in 1957 or later should have 1 or more doses of MMR vaccine unless there is a contraindication to the vaccine or there is laboratory evidence of immunity to each of the three diseases. A routine second dose of MMR vaccine should be obtained at least 28 days after the first dose for students attending postsecondary schools, health care workers, or international travelers. People who received inactivated measles vaccine or an unknown type of measles vaccine during 1963 1967 should receive 2 doses of MMR vaccine. People who received inactivated mumps vaccine or an unknown type of mumps vaccine  before 1979 and are at high risk for mumps infection should consider immunization with 2 doses of MMR vaccine. For females of childbearing age, rubella immunity should be determined. If there is no evidence of immunity, females who are not pregnant should be vaccinated. If there   is no evidence of immunity, females who are pregnant should delay immunization until after pregnancy. Unvaccinated health care workers born before 1957 who lack laboratory evidence of measles, mumps, or rubella immunity or laboratory confirmation of disease should consider measles and mumps immunization with 2 doses of MMR vaccine or rubella immunization with 1 dose of MMR vaccine.  Pneumococcal 13-valent conjugate (PCV13) vaccine. When indicated, a person who is uncertain of her immunization history and has no record of immunization should receive the PCV13 vaccine. An adult aged 19 years or older who has certain medical conditions and has not been previously immunized should receive 1 dose of PCV13 vaccine. This PCV13 should be followed with a dose of pneumococcal polysaccharide (PPSV23) vaccine. The PPSV23 vaccine dose should be obtained at least 8 weeks after the dose of PCV13 vaccine. An adult aged 19 years or older who has certain medical conditions and previously received 1 or more doses of PPSV23 vaccine should receive 1 dose of PCV13. The PCV13 vaccine dose should be obtained 1 or more years after the last PPSV23 vaccine dose.  Pneumococcal polysaccharide (PPSV23) vaccine. When PCV13 is also indicated, PCV13 should be obtained first. All adults aged 65 years and older should be immunized. An adult younger than age 65 years who has certain medical conditions should be immunized. Any person who resides in a nursing home or long-term care facility should be immunized. An adult smoker should be immunized. People with an immunocompromised condition and certain other conditions should receive both PCV13 and PPSV23 vaccines. People  with human immunodeficiency virus (HIV) infection should be immunized as soon as possible after diagnosis. Immunization during chemotherapy or radiation therapy should be avoided. Routine use of PPSV23 vaccine is not recommended for American Indians, [place] Natives, or people younger than 65 years unless there are medical conditions that require PPSV23 vaccine. When indicated, people who have unknown immunization and have no record of immunization should receive PPSV23 vaccine. One-time revaccination 5 years after the first dose of PPSV23 is recommended for people aged 19 64 years who have chronic kidney failure, nephrotic syndrome, asplenia, or immunocompromised conditions. People who received 1 2 doses of PPSV23 before age 65 years should receive another dose of PPSV23 vaccine at age 65 years or later if at least 5 years have passed since the previous dose. Doses of PPSV23 are not needed for people immunized with PPSV23 at or after age 65 years.  Meningococcal vaccine. Adults with asplenia or persistent complement component deficiencies should receive 2 doses of quadrivalent meningococcal conjugate (MenACWY-D) vaccine. The doses should be obtained at least 2 months apart. Microbiologists working with certain meningococcal bacteria, military recruits, people at risk during an outbreak, and people who travel to or live in countries with a high rate of meningitis should be immunized. A first-year college student up through age 21 years who is living in a residence hall should receive a dose if she did not receive a dose on or after her 16th birthday. Adults who have certain high-risk conditions should receive one or more doses of vaccine.  Hepatitis A vaccine. Adults who wish to be protected from this disease, have certain high-risk conditions, work with hepatitis A-infected animals, work in hepatitis A research labs, or travel to or work in countries with a high rate of hepatitis A should be immunized. Adults  who were previously unvaccinated and who anticipate close contact with an international adoptee during the first 60 days after arrival in the [place] from a country   with a high rate of hepatitis A should be immunized.  Hepatitis B vaccine. Adults who wish to be protected from this disease, have certain high-risk conditions, may be exposed to blood or other infectious body fluids, are household contacts or sex partners of hepatitis B positive people, are clients or workers in certain care facilities, or travel to or work in countries with a high rate of hepatitis B should be immunized.  Haemophilus influenzae type b (Hib) vaccine. A previously unvaccinated person with asplenia or sickle cell disease or having a scheduled splenectomy should receive 1 dose of Hib vaccine. Regardless of previous immunization, a recipient of a hematopoietic stem cell transplant should receive a 3-dose series 6 12 months after her successful transplant. Hib vaccine is not recommended for adults with HIV infection. Preventive Services / Frequency Ages 19 to 39  Blood pressure check.** / Every 1 to 2 years.  Lipid and cholesterol check.** / Every 5 years beginning at age 20.  Clinical breast exam.** / Every 3 years for women in their 20s and 30s.  BRCA-related cancer risk assessment.** / For women who have family members with a BRCA-related cancer (breast, ovarian, tubal, or peritoneal cancers).  Pap test.** / Every 2 years from ages 21 through 29. Every 3 years starting at age 30 through age 65 or 70 with a history of 3 consecutive normal Pap tests.  HPV screening.** / Every 3 years from ages 30 through ages 65 to 70 with a history of 3 consecutive normal Pap tests.  Hepatitis C blood test.** / For any individual with known risks for hepatitis C.  Skin self-exam. / Monthly.  Influenza vaccine. / Every year.  Tetanus, diphtheria, and acellular pertussis (Tdap, Td) vaccine.** / Consult your caregiver. Pregnant  women should receive 1 dose of Tdap vaccine during each pregnancy. 1 dose of Td every 10 years.  Varicella vaccine.** / Consult your caregiver. Pregnant females who do not have evidence of immunity should receive the first dose after pregnancy.  HPV vaccine. / 3 doses over 6 months, if 26 and younger. The vaccine is not recommended for use in pregnant females. However, pregnancy testing is not needed before receiving a dose.  Measles, mumps, rubella (MMR) vaccine.** / You need at least 1 dose of MMR if you were born in 1957 or later. You may also need a 2nd dose. For females of childbearing age, rubella immunity should be determined. If there is no evidence of immunity, females who are not pregnant should be vaccinated. If there is no evidence of immunity, females who are pregnant should delay immunization until after pregnancy.  Pneumococcal 13-valent conjugate (PCV13) vaccine.** / Consult your caregiver.  Pneumococcal polysaccharide (PPSV23) vaccine.** / 1 to 2 doses if you smoke cigarettes or if you have certain conditions.  Meningococcal vaccine.** / 1 dose if you are age 19 to 21 years and a first-year college student living in a residence hall, or have one of several medical conditions, you need to get vaccinated against meningococcal disease. You may also need additional booster doses.  Hepatitis A vaccine.** / Consult your caregiver.  Hepatitis B vaccine.** / Consult your caregiver.  Haemophilus influenzae type b (Hib) vaccine.** / Consult your caregiver. Ages 40 to 64  Blood pressure check.** / Every 1 to 2 years.  Lipid and cholesterol check.** / Every 5 years beginning at age 20.  Lung cancer screening. / Every year if you are aged 55 80 years and have a 30-pack-year history of smoking and   currently smoke or have quit within the past 15 years. Yearly screening is stopped once you have quit smoking for at least 15 years or develop a health problem that would prevent you from having  lung cancer treatment.  Clinical breast exam.** / Every year after age 40.  BRCA-related cancer risk assessment.** / For women who have family members with a BRCA-related cancer (breast, ovarian, tubal, or peritoneal cancers).  Mammogram.** / Every year beginning at age 40 and continuing for as long as you are in good health. Consult with your caregiver.  Pap test.** / Every 3 years starting at age 30 through age 65 or 70 with a history of 3 consecutive normal Pap tests.  HPV screening.** / Every 3 years from ages 30 through ages 65 to 70 with a history of 3 consecutive normal Pap tests.  Fecal occult blood test (FOBT) of stool. / Every year beginning at age 50 and continuing until age 75. You may not need to do this test if you get a colonoscopy every 10 years.  Flexible sigmoidoscopy or colonoscopy.** / Every 5 years for a flexible sigmoidoscopy or every 10 years for a colonoscopy beginning at age 50 and continuing until age 75.  Hepatitis C blood test.** / For all people born from 1945 through 1965 and any individual with known risks for hepatitis C.  Skin self-exam. / Monthly.  Influenza vaccine. / Every year.  Tetanus, diphtheria, and acellular pertussis (Tdap/Td) vaccine.** / Consult your caregiver. Pregnant women should receive 1 dose of Tdap vaccine during each pregnancy. 1 dose of Td every 10 years.  Varicella vaccine.** / Consult your caregiver. Pregnant females who do not have evidence of immunity should receive the first dose after pregnancy.  Zoster vaccine.** / 1 dose for adults aged 60 years or older.  Measles, mumps, rubella (MMR) vaccine.** / You need at least 1 dose of MMR if you were born in 1957 or later. You may also need a 2nd dose. For females of childbearing age, rubella immunity should be determined. If there is no evidence of immunity, females who are not pregnant should be vaccinated. If there is no evidence of immunity, females who are pregnant should delay  immunization until after pregnancy.  Pneumococcal 13-valent conjugate (PCV13) vaccine.** / Consult your caregiver.  Pneumococcal polysaccharide (PPSV23) vaccine.** / 1 to 2 doses if you smoke cigarettes or if you have certain conditions.  Meningococcal vaccine.** / Consult your caregiver.  Hepatitis A vaccine.** / Consult your caregiver.  Hepatitis B vaccine.** / Consult your caregiver.  Haemophilus influenzae type b (Hib) vaccine.** / Consult your caregiver. Ages 65 and over  Blood pressure check.** / Every 1 to 2 years.  Lipid and cholesterol check.** / Every 5 years beginning at age 20.  Lung cancer screening. / Every year if you are aged 55 80 years and have a 30-pack-year history of smoking and currently smoke or have quit within the past 15 years. Yearly screening is stopped once you have quit smoking for at least 15 years or develop a health problem that would prevent you from having lung cancer treatment.  Clinical breast exam.** / Every year after age 40.  BRCA-related cancer risk assessment.** / For women who have family members with a BRCA-related cancer (breast, ovarian, tubal, or peritoneal cancers).  Mammogram.** / Every year beginning at age 40 and continuing for as long as you are in good health. Consult with your caregiver.  Pap test.** / Every 3 years starting at age   30 through age 57 or 85 with a 3 consecutive normal Pap tests. Testing can be stopped between 65 and 70 with 3 consecutive normal Pap tests and no abnormal Pap or HPV tests in the past 10 years.  HPV screening.** / Every 3 years from ages 34 through ages 36 or 18 with a history of 3 consecutive normal Pap tests. Testing can be stopped between 65 and 70 with 3 consecutive normal Pap tests and no abnormal Pap or HPV tests in the past 10 years.  Fecal occult blood test (FOBT) of stool. / Every year beginning at age 61 and continuing until age 24. You may not need to do this test if you get a colonoscopy  every 10 years.  Flexible sigmoidoscopy or colonoscopy.** / Every 5 years for a flexible sigmoidoscopy or every 10 years for a colonoscopy beginning at age 78 and continuing until age 6.  Hepatitis C blood test.** / For all people born from [birth year] through 1966 and any individual with known risks for hepatitis C.  Osteoporosis screening.** / A one-time screening for women ages 40 and over and women at risk for fractures or osteoporosis.  Skin self-exam. / Monthly.  Influenza vaccine. / Every year.  Tetanus, diphtheria, and acellular pertussis (Tdap/Td) vaccine.** / 1 dose of Td every 10 years.  Varicella vaccine.** / Consult your caregiver.  Zoster vaccine.** / 1 dose for adults aged 29 years or older.  Pneumococcal 13-valent conjugate (PCV13) vaccine.** / Consult your caregiver.  Pneumococcal polysaccharide (PPSV23) vaccine.** / 1 dose for all adults aged 9 years and older.  Meningococcal vaccine.** / Consult your caregiver.  Hepatitis A vaccine.** / Consult your caregiver.  Hepatitis B vaccine.** / Consult your caregiver.  Haemophilus influenzae type b (Hib) vaccine.** / Consult your caregiver. ** Family history and personal history of risk and conditions may change your caregiver's recommendations. Document Released: 05/04/2001 Document Revised: 07/03/2012 Document Reviewed: 08/03/2010 [facility] Patient Information 2014 [place], [place].  Thank you for enrolling in MyChart. Please follow the instructions below to securely access your online medical record. MyChart allows you to send messages to your doctor, view your test results, manage appointments, and more.   How Do I Sign Up? 1. In your Internet browser, go to [employer] and enter [email or web address]. 2. Click on the Sign Up Now link in the Sign In box. You will see the New Member Sign Up page. 3. Enter your MyChart Access Code exactly as it appears below. You will not need to use this code after you've  completed the sign-up process. If you do not sign up before the expiration date, you must request a new code.  MyChart Access Code: [email or web address] Expires: 04/29/2013 10:49 AM  4. Enter your Social Security Number ([email or web address]) and Date of Birth (mm/dd/yyyy) as indicated and click Submit. You will be taken to the next sign-up page. 5. Create a MyChart ID. This will be your MyChart login ID and cannot be changed, so think of one that is secure and easy to remember. 6. Create a MyChart password. You can change your password at any time. 7. Enter your Password Reset Question and Answer. This can be used at a later time if you forget your password.  8. Enter your e-mail address. You will receive e-mail notification when new information is available in MyChart. 9. Click Sign Up. You can now view your medical record.   Additional Information Remember, MyChart is NOT to be used for urgent needs. For medical emergencies, dial  911.    

## 2013-03-01 ENCOUNTER — Encounter: Payer: Self-pay | Admitting: Obstetrics & Gynecology

## 2013-03-01 LAB — HEPATITIS C ANTIBODY: HCV Ab: NEGATIVE

## 2013-03-01 LAB — HEPATITIS B SURFACE ANTIGEN: Hepatitis B Surface Ag: NEGATIVE

## 2013-03-01 LAB — HIV ANTIBODY (ROUTINE TESTING W REFLEX): HIV: NONREACTIVE

## 2013-03-05 NOTE — Progress Notes (Signed)
Notified patient of test results, she will follow up in one year for her routine physical.

## 2013-05-03 ENCOUNTER — Ambulatory Visit: Payer: Self-pay | Admitting: Medical

## 2013-05-10 ENCOUNTER — Ambulatory Visit (INDEPENDENT_AMBULATORY_CARE_PROVIDER_SITE_OTHER): Payer: BC Managed Care – PPO | Admitting: Sports Medicine

## 2013-05-10 ENCOUNTER — Encounter: Payer: Self-pay | Admitting: Sports Medicine

## 2013-05-10 VITALS — BP 116/79 | HR 53 | Ht 64.0 in | Wt 122.0 lb

## 2013-05-10 DIAGNOSIS — M25579 Pain in unspecified ankle and joints of unspecified foot: Secondary | ICD-10-CM

## 2013-05-10 MED ORDER — MELOXICAM 15 MG PO TABS: ORAL_TABLET | ORAL | Status: DC

## 2013-05-11 NOTE — Progress Notes (Signed)
   Subjective:    Patient ID: Tonya Carrillo, female    DOB: 1981-02-01, 33 y.o.   MRN: [id number]  HPI chief complaint: Right foot pain  33 year old female runner comes in today complaining of 1 month of dorsal right foot pain. No injury that she can recall but rather sudden onset of pain that she localizes to the dorsum of her ankles/mid foot. It began acutely while running. She has noticed some mild swelling in the area. She began to walk with a limp and as a consequence was evaluated by an orthopedic PA at [facility]. She was told that she may have some sort of fracture in her foot and an appointment was made for an orthopedist in 3 weeks. She decided to come see [place] instead. She has not been running for the past 10 days. As a result her symptoms have improved but not resolved. Her pain is now intermittent. She works as a Pharmacist, hospital and standing and twisting on her foot and ankle will reproduce pain on occasion. She was initially getting some radiating pain up into the lower leg as well. She has tried swimming but has noticed that that too will cause her some discomfort. No similar problems in the past. She did suffer a metatarsal fracture while playing soccer many years ago.  Past medical history is reviewed Current medications reviewed Allergies reviewed    Review of Systems As above    Objective:   Physical Exam Well-developed, fit-appearing. No acute distress. Awake alert and oriented x3.  Right foot and ankle: Ankle shows full range of motion. There is no effusion. There is no bony or soft tissue tenderness to direct palpation. There is a reproducible pain with resisted ankle dorsiflexion which the patient localizes to the area of the anterior tibialis tendon. There is no swelling here however. Pain is also reproduced with the ankle in a full dorsi flexed position and forced dorsi flexion of the forefoot. No tenderness to palpation or percussion along the tibia. Fairly neutral arches. Walking  with a slight limp.  An x-ray report from [facility] is available for review. Report seems to indicate that there is an old dorsal avulsion fracture off of the navicular but nothing acute is seen. X-rays are not available for my personal review. [facility] ultrasound of the right ankle: There is some mild hypoechoic changes in the anterior tibialis tendon. No discrete tear is seen. Extensor hallucis longus tendon appears to be within normal limits.      Assessment & Plan:  Right foot/ankle pain-question anterior tibialis tendon strain  I would like to try a body helix compression sleeve for her ankle. We'll place her on Mobic 15 mg daily for 7 days. I think she should cross train for another week or so but if her symptoms allow she can start to increase her running after that time. She will followup with me in 3 weeks. In the meantime she will drop off her x-rays for my review. She will bring her running shoes with her to her next visit so that we can evaluate her running form. She may need some arch support as well. If symptoms persist I will need to consider merits of further diagnostic imaging. Patient will call with questions or concerns in the interim.

## 2013-05-16 ENCOUNTER — Telehealth: Payer: Self-pay | Admitting: Sports Medicine

## 2013-05-16 ENCOUNTER — Encounter: Payer: Self-pay | Admitting: Sports Medicine

## 2013-05-16 NOTE — Telephone Encounter (Signed)
I was able to review the x-ray of her right foot. There appears to be evidence of an old dorsal navicular fracture which is best seen on the lateral view. Interestingly enough, however, the patient denies any significant remote trauma to this foot or ankle in the past. Since this does appear to be chronic,it does not change my treatment at this point. In fact, she tells me that the Mobic is helping her quite a bit. She is also wearing her body helix compression sleeve. She has a followup visit with me in 2 weeks. If she continues to struggle despite our current treatment plan then I may entertain the idea of further diagnostic imaging since this old dorsal navicular fracture is in the same area as her pain. Additionally, I think that we should try to get her into a good arch support at her followup visit to help remove the stress on this area of her midfoot.

## 2013-05-21 ENCOUNTER — Ambulatory Visit: Payer: Self-pay | Admitting: Podiatry

## 2013-05-31 ENCOUNTER — Ambulatory Visit (INDEPENDENT_AMBULATORY_CARE_PROVIDER_SITE_OTHER): Payer: BC Managed Care – PPO | Admitting: Sports Medicine

## 2013-05-31 ENCOUNTER — Encounter: Payer: Self-pay | Admitting: Sports Medicine

## 2013-05-31 VITALS — BP 116/79 | HR 65 | Ht 64.0 in | Wt 122.0 lb

## 2013-05-31 DIAGNOSIS — M25579 Pain in unspecified ankle and joints of unspecified foot: Secondary | ICD-10-CM

## 2013-05-31 NOTE — Progress Notes (Signed)
   Subjective:    Patient ID: Tonya Carrillo, female    DOB: 05/03/1980, 33 y.o.   MRN: [id number]  HPI Patient comes in today for followup on right dorsal foot pain. Overall, her pain seems to be improving. She has been doing some running on her treadmill without any returning pain. She is up to 4 miles and is running at a nice easy pace. The meloxicam caused some dizziness and stomach upset so she stopped it. She has found the body helix compression sleeve to be helpful. She's not noticed any swelling. I was able to review the x-rays done at [facility] of her right foot. Although these films do show evidence of a possible old dorsal navicular avulsion fracture, the patient denies any significant prior trauma to this foot or ankle. However, this is the area of her previous pain.    Review of Systems     Objective:   Physical Exam Well-developed, well-nourished. No acute distress  Right foot: No real tenderness to palpation over the navicular nor over the anterior tibialis tendon. There is no soft tissue swelling. She has a fairly neutral arch. Excellent running form without evidence of pronation. No limping. Neurovascularly intact distally.       Assessment & Plan:  Improving dorsal right foot pain with x-ray evidence of possible old dorsal navicular avulsion fracture  Without a history of trauma I am not convinced that the x-ray findings are consistent with an old dorsal navicular avulsion fracture. This may be some sort of anatomic variant. I certainly do not see anything acute on her x-rays. Since she is doing better, I will allow her to continue to increase running as tolerated. I did stress to her the importance of following the 10% rule. I think she should continue using her compression sleeve. I will also try a small scaphoid pad in her running shoes but I've given her permission to remove this if she notices that it is causing too much pressure on the dorsum of  her foot. As long as she is able to gradually increase her activity and return to her previous level of activity without difficulty I will see her on a when necessary basis. However, if pain begins to return I would start with further diagnostic imaging likely in the form of an MRI.

## 2014-01-21 ENCOUNTER — Encounter: Payer: Self-pay | Admitting: Sports Medicine

## 2015-02-04 ENCOUNTER — Ambulatory Visit (INDEPENDENT_AMBULATORY_CARE_PROVIDER_SITE_OTHER): Payer: BLUE CROSS/BLUE SHIELD | Admitting: Family Medicine

## 2015-02-04 ENCOUNTER — Ambulatory Visit: Payer: Self-pay | Admitting: Family Medicine

## 2015-02-04 ENCOUNTER — Encounter: Payer: Self-pay | Admitting: Family Medicine

## 2015-02-04 VITALS — BP 96/64 | HR 62 | Temp 98.6°F | Ht 64.25 in | Wt 131.5 lb

## 2015-02-04 DIAGNOSIS — J301 Allergic rhinitis due to pollen: Secondary | ICD-10-CM

## 2015-02-04 DIAGNOSIS — F418 Other specified anxiety disorders: Secondary | ICD-10-CM

## 2015-02-04 DIAGNOSIS — J45990 Exercise induced bronchospasm: Secondary | ICD-10-CM | POA: Diagnosis not present

## 2015-02-04 DIAGNOSIS — Z13220 Encounter for screening for lipoid disorders: Secondary | ICD-10-CM

## 2015-02-04 MED ORDER — ALPRAZOLAM 0.25 MG PO TABS: 0.2500 mg | ORAL_TABLET | Freq: Every day | ORAL | Status: DC | PRN

## 2015-02-04 NOTE — Patient Instructions (Signed)
Stop at lab on way out. 

## 2015-02-04 NOTE — Assessment & Plan Note (Signed)
Prescription given for small amount of alprazolam as needed for trip.

## 2015-02-04 NOTE — Progress Notes (Signed)
Pre visit review using our clinic review tool, if applicable. No additional management support is needed unless otherwise documented below in the visit note. 

## 2015-02-04 NOTE — Progress Notes (Signed)
   Subjective:    Patient ID: Tonya Carrillo, female    DOB: 1981-02-18, 34 y.o.   MRN: [id number]  HPI   34 year old female presents to re-establish care. Previously seen by myself in 2012. She has GYN Dr. Harolyn Rutherford, last VO 2014 for CPX.Marland Kitchen Plans on returning there for pelvic exams.  Exercise induced asthma: Has inhaler but has not used since in [place] in 2009.     Seasonal allergies: Using daily claritin, controls symptoms well. Takes year round.  Anxiety,situational, panic attacks: She is going to [place] for 10 days at end of week. She has panic attacks on planes.. Wants something to use on flight and bus trips. Has used ativan in past.. Took a while to kick in.  Would like to try alprazolam instead. No issues on day to day basis, no SI, no HI, no depression.  No insomnia.  Exercise daily.  Diet: vegetarian.  Went through divorce in past few years.    Review of Systems  Constitutional: Negative for fever and fatigue.  HENT: Negative for ear pain.   Eyes: Positive for itching. Negative for pain.  Respiratory: Negative for chest tightness and shortness of breath.   Cardiovascular: Negative for chest pain, palpitations and leg swelling.  Gastrointestinal: Negative for abdominal pain.  Genitourinary: Negative for dysuria.       Objective:   Physical Exam  Constitutional: Vital signs are normal. She appears well-developed and well-nourished. She is cooperative.  Non-toxic appearance. She does not appear ill. No distress.  HENT:  Head: Normocephalic.  Right Ear: Hearing, tympanic membrane, external ear and ear canal normal.  Left Ear: Hearing, tympanic membrane, external ear and ear canal normal.  Nose: Nose normal.  Eyes: Conjunctivae, EOM and lids are normal. Pupils are equal, round, and reactive to light. Lids are everted and swept, no foreign bodies found.  Neck: Trachea normal and normal range of motion. Neck supple. Carotid bruit is not present. No thyroid mass and no  thyromegaly present.  Cardiovascular: Normal rate, regular rhythm, S1 normal, S2 normal, normal heart sounds and intact distal pulses.  Exam reveals no gallop.   No murmur heard. Pulmonary/Chest: Effort normal and breath sounds normal. No respiratory distress. She has no wheezes. She has no rhonchi. She has no rales.  Abdominal: Soft. Normal appearance and bowel sounds are normal. She exhibits no distension, no fluid wave, no abdominal bruit and no mass. There is no hepatosplenomegaly. There is no tenderness. There is no rebound, no guarding and no CVA tenderness. No hernia.  Lymphadenopathy:    She has no cervical adenopathy.    She has no axillary adenopathy.  Neurological: She is alert. She has normal strength. No cranial nerve deficit or sensory deficit.  Skin: Skin is warm, dry and intact. No rash noted.  Psychiatric: Her speech is normal and behavior is normal. Judgment normal. Her mood appears not anxious. Cognition and memory are normal. She does not exhibit a depressed mood.          Assessment & Plan:

## 2015-02-04 NOTE — Assessment & Plan Note (Signed)
Well controlled on no controller. Has albuterol to use prn.

## 2015-02-04 NOTE — Assessment & Plan Note (Signed)
Stable control on Claritin daily.

## 2015-02-05 ENCOUNTER — Telehealth: Payer: Self-pay | Admitting: Family Medicine

## 2015-02-05 ENCOUNTER — Other Ambulatory Visit: Payer: Self-pay | Admitting: Internal Medicine

## 2015-02-05 DIAGNOSIS — J4520 Mild intermittent asthma, uncomplicated: Secondary | ICD-10-CM

## 2015-02-05 LAB — COMPREHENSIVE METABOLIC PANEL
ALBUMIN: 4.4 g/dL (ref 3.5–5.2)
ALK PHOS: 51 U/L (ref 39–117)
ALT: 22 U/L (ref 0–35)
AST: 31 U/L (ref 0–37)
BILIRUBIN TOTAL: 0.4 mg/dL (ref 0.2–1.2)
BUN: 20 mg/dL (ref 6–23)
CALCIUM: 9.4 mg/dL (ref 8.4–10.5)
CO2: 28 mEq/L (ref 19–32)
CREATININE: 0.75 mg/dL (ref 0.40–1.20)
Chloride: 102 mEq/L (ref 96–112)
GFR: 93.1 mL/min (ref >60.00)
Glucose, Bld: 87 mg/dL (ref 70–99)
Potassium: 4.2 mEq/L (ref 3.5–5.1)
SODIUM: 138 meq/L (ref 135–145)
TOTAL PROTEIN: 7.1 g/dL (ref 6.0–8.3)

## 2015-02-05 LAB — LIPID PANEL
Cholesterol: 184 mg/dL (ref 0–200)
HDL: 89.3 mg/dL (ref >39.00)
LDL CALC: 83 mg/dL (ref 0–99)
NonHDL: 94.39
TRIGLYCERIDES: 55 mg/dL (ref 0.0–149.0)
Total CHOL/HDL Ratio: 2
VLDL: 11 mg/dL (ref 0.0–40.0)

## 2015-02-05 MED ORDER — ALBUTEROL SULFATE HFA 108 (90 BASE) MCG/ACT IN AERS: 2.0000 | INHALATION_SPRAY | Freq: Four times a day (QID) | RESPIRATORY_TRACT | Status: DC | PRN

## 2015-02-05 NOTE — Telephone Encounter (Signed)
Pt called asking about rx for albuterol being called into the [facility] on [address] in [place]. She said they have not received the rx yet. She is needing this to be done today as she is going out of town. She can be reached at [phone] when it has been done.

## 2015-02-05 NOTE — Telephone Encounter (Signed)
RX sent

## 2015-02-05 NOTE — Telephone Encounter (Signed)
Left message for Tonya Carrillo that the prescription for her albuterol inhaler has been sent to [facility].

## 2015-08-26 ENCOUNTER — Other Ambulatory Visit: Payer: Self-pay | Admitting: Family Medicine

## 2015-08-26 MED ORDER — ALPRAZOLAM 0.25 MG PO TABS: 0.2500 mg | ORAL_TABLET | Freq: Every day | ORAL | Status: DC | PRN

## 2015-08-26 NOTE — Telephone Encounter (Signed)
Last office visit 02/04/2015.  Last refilled 02/04/2015 for #15 with no refills.  Ok to refill?

## 2015-08-26 NOTE — Telephone Encounter (Signed)
Alprazolam called into [facility]

## 2015-10-14 ENCOUNTER — Ambulatory Visit (INDEPENDENT_AMBULATORY_CARE_PROVIDER_SITE_OTHER): Payer: BLUE CROSS/BLUE SHIELD | Admitting: Obstetrics and Gynecology

## 2015-10-14 ENCOUNTER — Encounter: Payer: Self-pay | Admitting: Obstetrics and Gynecology

## 2015-10-14 VITALS — BP 129/77 | HR 60 | Wt 133.0 lb

## 2015-10-14 DIAGNOSIS — Z01419 Encounter for gynecological examination (general) (routine) without abnormal findings: Secondary | ICD-10-CM

## 2015-10-14 DIAGNOSIS — Z124 Encounter for screening for malignant neoplasm of cervix: Secondary | ICD-10-CM

## 2015-10-14 NOTE — Progress Notes (Signed)
Subjective:     Tonya Carrillo is a 35 y.o. female G0P0 who is here for a comprehensive physical exam. The patient reports no problems. She reports monthly cycles of 29 days. She denies any pelvic/abdominal pain. She denies any abnormal discharge. She is sexually active using natural family planning which has worked for her for the past 2 years. She is engaged and is uncertain if she desires to have children  Past Medical History:  Diagnosis Date  . Asthma   . GERD (gastroesophageal reflux disease)   . History of chicken pox   . UTI (lower urinary tract infection)    Past Surgical History:  Procedure Laterality Date  . BREAST SURGERY  2004   cyst removed  . NECK SURGERY  2007   cyst removed from left side of neck  . TONSILLECTOMY     Family History  Problem Relation Age of Onset  . Cancer Father     multiple myeloma  . Obesity Brother   . Stroke Mother   . Hypertension Mother   . Hyperlipidemia Mother   . Thyroid disease Mother   . Cancer Maternal Grandmother     breast  . Cancer Maternal Grandfather 39    colon   . ALS Paternal Grandfather     Social History   Social History  . Marital status: Married    Spouse name: N/A  . Number of children: 0  . Years of education: N/A   Occupational History  . 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 Smoking status: Former Smoker    Packs/day: 1.00    Years: 4.00    Types: Cigarettes  . Smokeless tobacco: Former Systems developer    Quit date: 02/04/2007  . Alcohol use 0.6 oz/week    1 Glasses of wine per week     Comment: wine or beer nightly  . Drug use: No  . Sexual activity: Yes    Birth control/ protection: Condom   Other Topics Concern  . Not on file   Social History Narrative   Diet Vegetarian.. Eats a lot of protein limits sweets      Regular exercise running, weights, yoga   Health Maintenance  Topic Date Due  . INFLUENZA VACCINE  10/21/2015  . PAP SMEAR  02/29/2016  . TETANUS/TDAP   05/05/2020  . HIV Screening  Completed       Review of Systems Pertinent items are noted in HPI.   Objective:  Blood pressure 129/77, pulse 60, weight 133 lb (60.3 kg), last menstrual period 10/07/2015.     GENERAL: Well-developed, well-nourished female in no acute distress.  HEENT: Normocephalic, atraumatic. Sclerae anicteric.  NECK: Supple. Normal thyroid.  LUNGS: Clear to auscultation bilaterally.  HEART: Regular rate and rhythm. BREASTS: Symmetric in size. No palpable masses or lymphadenopathy, skin changes, or nipple drainage. ABDOMEN: Soft, nontender, nondistended. No organomegaly. PELVIC: Normal external female genitalia. Vagina is pink and rugated.  Normal discharge. Normal appearing cervix. Uterus is normal in size.  No adnexal mass or tenderness. EXTREMITIES: No cyanosis, clubbing, or edema, 2+ distal pulses.    Assessment:    Healthy female exam.      Plan:    pap smear collected Patient advised to perform a monthly self breast exam Discussed changing her multivitamin to prenatal vitamins in the event that a pregnancy occurred and patient is agreeable to that Patient will be contacted with any abnormal results See After Visit Summary for Counseling Recommendations

## 2015-10-15 LAB — CYTOLOGY - PAP

## 2015-12-13 DIAGNOSIS — J029 Acute pharyngitis, unspecified: Secondary | ICD-10-CM | POA: Diagnosis not present

## 2015-12-13 DIAGNOSIS — J019 Acute sinusitis, unspecified: Secondary | ICD-10-CM | POA: Diagnosis not present

## 2016-04-16 DIAGNOSIS — H00015 Hordeolum externum left lower eyelid: Secondary | ICD-10-CM | POA: Diagnosis not present

## 2016-06-18 ENCOUNTER — Ambulatory Visit
Admission: RE | Admit: 2016-06-18 | Discharge: 2016-06-18 | Disposition: A | Discharge status: Home / Self Care | Payer: BLUE CROSS/BLUE SHIELD | Source: Ambulatory Visit | Attending: Medical | Admitting: Medical

## 2016-06-18 ENCOUNTER — Ambulatory Visit: Payer: Self-pay | Admitting: Medical

## 2016-06-18 VITALS — BP 104/74 | HR 75 | Temp 98.9°F | Resp 16 | Ht 64.0 in | Wt 134.0 lb

## 2016-06-18 DIAGNOSIS — M79672 Pain in left foot: Secondary | ICD-10-CM | POA: Diagnosis not present

## 2016-06-18 DIAGNOSIS — S92345A Nondisplaced fracture of fourth metatarsal bone, left foot, initial encounter for closed fracture: Secondary | ICD-10-CM

## 2016-06-18 NOTE — Progress Notes (Addendum)
Patient scheduled for  Appointment  06/23/16  1:45 pm with Dr. Scherrie Carrillo  At [facility]  Subjective:    Patient ID: Tonya Carrillo, female    DOB: 06-Feb-1981, 36 y.o.   MRN: [id number]  HPI 36 yo with more than 7 days of aching in left lateral side of foot. Seemed to occur only in the morning. Changed yesterday while running , ran 4.5 miles. Pain started half way through the run continued to have increased pain after she stopped running. No numbness or tingling.    Review of Systems  Constitutional: Negative for chills and fatigue.  HENT: Positive for sinus pain and sinus pressure. Negative for rhinorrhea and sneezing.   Eyes: Negative.   Respiratory: Positive for chest tightness. Negative for cough and shortness of breath.   Cardiovascular: Negative for chest pain.  Gastrointestinal: Negative for diarrhea, nausea and vomiting.    Left foot pain laterally    Objective:   Physical Exam  Left foot - In comparison to right foot , mild edema in 4 th and 5th toes.  No pain with palpation of tarsal or phalanges on dorsum of foot. Tender with palpation at the 4th and 5th metatarsal when palpated from the plantar surface. 2+ DP/PT pulses, <2x CR.      Assessment & Plan:   Stress fracture versus tendonitis , sent patient for complete left foot xray. Patient placed in post-op shoe.  To take otc motrin as directed for pain and swelling. Ice and elevate the foot.  Would like to use  her podiatrist in [place]. [facility] [phone]). Referral done.  Xray results of left foot: Suspect stress type fracture proximal fourth metatarsal with alignment essentially anatomic. No other fracture. No dislocation. Suspect old trauma with remodeling between the distal talus and proximal navicular dorsally. No appreciable arthropathy  Called patient with xray results, given crutches. Non-weight bearing.  Encouraged patient to use her inhaler if chest  tightness or her asthma is bothering her.

## 2016-06-23 ENCOUNTER — Ambulatory Visit: Payer: Self-pay

## 2016-06-23 ENCOUNTER — Ambulatory Visit (INDEPENDENT_AMBULATORY_CARE_PROVIDER_SITE_OTHER): Payer: BLUE CROSS/BLUE SHIELD | Admitting: Student

## 2016-06-23 ENCOUNTER — Encounter: Payer: Self-pay | Admitting: Student

## 2016-06-23 VITALS — BP 127/83 | Ht 64.0 in | Wt 134.0 lb

## 2016-06-23 DIAGNOSIS — M79672 Pain in left foot: Secondary | ICD-10-CM | POA: Diagnosis not present

## 2016-06-23 NOTE — Progress Notes (Signed)
  Tonya Carrillo - 36 y.o. female MRN [id number]  Date of birth: 18-Apr-1980  SUBJECTIVE:  Including CC & ROS.  CC: Left foot pain  Presents with left foot pain that began 2 weeks ago. She states it started as an ache in her foot that would only occur in the morning. She noted last week during a 4-1/2 mile run that she had exquisite pain.  She was seen at [facility] and had x-ray performed.  It appeared that she could have a stress-type injury to her proximal fourth metatarsal. She was put in a postop shoe and crutches. She is no longer using crutches.  She states that she had actually decreased her mileage per week but increased her longest run to 6 miles. She has not training for anything in particular but she does running for stress relief. She runs about 15-25 miles per week.  She denies any numbness or tingling. She is having decreased pain today after resting. She never had any bruising or much swelling.    ROS: No unexpected weight loss, fever, chills, swelling, instability, muscle pain, numbness/tingling, redness, otherwise see HPI   PMHx - Updated and reviewed.  Contributory factors include: Negative PSHx - Updated and reviewed.  Contributory factors include:  Negative FHx - Updated and reviewed.  Contributory factors include:  Negative Social Hx - Updated and reviewed. Contributory factors include: Runner, recreational Medications - reviewed   DATA REVIEWED: 3 view left foot x-ray which showed a sclerotic navicular talus calcification.  PHYSICAL EXAM:  VS: BP:127/83  HR: bpm  TEMP: ( )  RESP:   HT:5\' 4"  (162.6 cm)   WT:134 lb (60.8 kg)  BMI:23 PHYSICAL EXAM: Gen: NAD, alert, cooperative with exam, well-appearing HEENT: clear conjunctiva,  CV:  no edema, capillary refill brisk, normal rate Resp: non-labored Skin: no rashes, normal turgor  Neuro: no gross deficits.  Psych:  alert and oriented  Ankle & Foot: No visible swelling, ecchymosis, erythema, ulcers,  calluses, blister Arch: Normal w/o pes cavus or planus, but does have fallen arches with standing No pain at MT heads Has mild ttp over left 3rd and 4th metatarsal proximally, but no point tenderness No pain at base of 5th MT; No tenderness over cuboid; No tenderness over N spot or navicular prominence No tenderness on lateral and medial malleolus No sign of peroneal tendon subluxations or tenderness to palpation Full in plantarflexion, dorsiflexion, inversion, and eversion of the foot; flexion and extension of the toes Strength: 5/5 in all directions. Sensation: intact Vascular: intact w/ dorsalis pedis & posterior tibialis pulses 2+  Ultrasound: Limited of left foot 4th metatarsal with no fracture, callus, or increased blood flow Swelling seen superficially to bone along 3rd and 4th metatarsal  Summary: No stress fracture seen on ultrasound  Ultrasound performed and dictated by Melton Krebs, DO   ASSESSMENT & PLAN:   Left foot pain Ultrasound evaluation did not show any signs of a stress fracture. There were swelling and soft tissue superficial to the bone.  Examination today is not typical of a stress fracture. May have a stress reaction. Recommended using the postop shoe as needed especially if she is going to be walking a lot. No running for 2 weeks at least. Follow-up to reevaluate with an ultrasound. If she is having increasing amounts of pain or pain at rest would recommend coming in sooner for reevaluation.

## 2016-06-23 NOTE — Assessment & Plan Note (Addendum)
Ultrasound evaluation did not show any signs of a stress fracture. There were swelling and soft tissue superficial to the bone.  Examination today is not typical of a stress fracture. May have a stress reaction. Recommended using the postop shoe as needed especially if she is going to be walking a lot. No running for 2 weeks at least. Follow-up to reevaluate with an ultrasound. If she is having increasing amounts of pain or pain at rest would recommend coming in sooner for reevaluation. Would consider running evaluation at that time if she is able to run on it again

## 2016-08-02 ENCOUNTER — Other Ambulatory Visit: Payer: Self-pay | Admitting: Podiatry

## 2016-08-02 DIAGNOSIS — M79672 Pain in left foot: Secondary | ICD-10-CM | POA: Diagnosis not present

## 2016-08-02 DIAGNOSIS — M84375A Stress fracture, left foot, initial encounter for fracture: Secondary | ICD-10-CM | POA: Diagnosis not present

## 2016-08-02 DIAGNOSIS — S92345A Nondisplaced fracture of fourth metatarsal bone, left foot, initial encounter for closed fracture: Secondary | ICD-10-CM

## 2016-08-11 ENCOUNTER — Ambulatory Visit
Admission: RE | Admit: 2016-08-11 | Discharge: 2016-08-11 | Disposition: A | Discharge status: Home / Self Care | Payer: BLUE CROSS/BLUE SHIELD | Source: Ambulatory Visit | Attending: Podiatry | Admitting: Podiatry

## 2016-08-11 DIAGNOSIS — M84375A Stress fracture, left foot, initial encounter for fracture: Secondary | ICD-10-CM | POA: Diagnosis not present

## 2016-08-11 DIAGNOSIS — S92345A Nondisplaced fracture of fourth metatarsal bone, left foot, initial encounter for closed fracture: Secondary | ICD-10-CM

## 2016-08-11 DIAGNOSIS — S92355A Nondisplaced fracture of fifth metatarsal bone, left foot, initial encounter for closed fracture: Secondary | ICD-10-CM | POA: Diagnosis not present

## 2016-08-11 DIAGNOSIS — X58XXXA Exposure to other specified factors, initial encounter: Secondary | ICD-10-CM | POA: Insufficient documentation

## 2016-08-23 DIAGNOSIS — M84375A Stress fracture, left foot, initial encounter for fracture: Secondary | ICD-10-CM | POA: Diagnosis not present

## 2016-08-23 DIAGNOSIS — M79672 Pain in left foot: Secondary | ICD-10-CM | POA: Diagnosis not present

## 2016-09-15 DIAGNOSIS — M84375D Stress fracture, left foot, subsequent encounter for fracture with routine healing: Secondary | ICD-10-CM | POA: Diagnosis not present

## 2016-10-05 ENCOUNTER — Emergency Department
Admission: EM | Admit: 2016-10-05 | Discharge: 2016-10-05 | Disposition: A | Discharge status: Home / Self Care | Payer: BLUE CROSS/BLUE SHIELD | Attending: Emergency Medicine | Admitting: Emergency Medicine

## 2016-10-05 ENCOUNTER — Encounter: Payer: Self-pay | Admitting: Emergency Medicine

## 2016-10-05 DIAGNOSIS — Z87891 Personal history of nicotine dependence: Secondary | ICD-10-CM | POA: Insufficient documentation

## 2016-10-05 DIAGNOSIS — Y939 Activity, unspecified: Secondary | ICD-10-CM | POA: Insufficient documentation

## 2016-10-05 DIAGNOSIS — S61011A Laceration without foreign body of right thumb without damage to nail, initial encounter: Secondary | ICD-10-CM | POA: Diagnosis not present

## 2016-10-05 DIAGNOSIS — Y999 Unspecified external cause status: Secondary | ICD-10-CM | POA: Insufficient documentation

## 2016-10-05 DIAGNOSIS — Y929 Unspecified place or not applicable: Secondary | ICD-10-CM | POA: Insufficient documentation

## 2016-10-05 DIAGNOSIS — W260XXA Contact with knife, initial encounter: Secondary | ICD-10-CM | POA: Diagnosis not present

## 2016-10-05 DIAGNOSIS — J45909 Unspecified asthma, uncomplicated: Secondary | ICD-10-CM | POA: Insufficient documentation

## 2016-10-05 DIAGNOSIS — S6991XA Unspecified injury of right wrist, hand and finger(s), initial encounter: Secondary | ICD-10-CM | POA: Diagnosis present

## 2016-10-05 MED ORDER — SULFAMETHOXAZOLE-TRIMETHOPRIM 800-160 MG PO TABS: 1.0000 | ORAL_TABLET | Freq: Two times a day (BID) | ORAL | 0 refills | Status: AC

## 2016-10-05 MED ORDER — LIDOCAINE HCL (PF) 1 % IJ SOLN
INTRAMUSCULAR | Status: AC
  Filled 2016-10-05: qty 5

## 2016-10-05 MED ORDER — LIDOCAINE HCL 1 % IJ SOLN
5.0000 mL | Freq: Once | INTRAMUSCULAR | Status: DC
  Filled 2016-10-05: qty 5

## 2016-10-05 NOTE — ED Triage Notes (Addendum)
Pt presents to ED with laceration to her right thumb. Pt states she picked up the knife to wash it and dropped it cutting her finger. Bleeding controlled while wrapped. bleeding slightly while unwrapped. Bandage reapplied in triage.

## 2016-10-05 NOTE — ED Provider Notes (Signed)
[facility] Emergency Department Provider Note  ____________________________________________  Time seen: Approximately 11:09 PM  I have reviewed the triage vital signs and the nursing notes.   HISTORY  Chief Complaint Laceration    HPI Tonya Carrillo is a 36 y.o. female presenting to the emergency department with a 1-1/2 cm laceration of the right thumb. Patient sustained laceration with a knife while cutting dinner tonight. Patient denies radiculopathy, weakness or changes in sensation of the right upper extremity. Patient applied a clean dressing but no other alleviating measures have been attempted.   Past Medical History:  Diagnosis Date  . Asthma   . GERD (gastroesophageal reflux disease)   . History of chicken pox   . UTI (lower urinary tract infection)     Patient Active Problem List   Diagnosis Date Noted  . Left foot pain 06/23/2016  . Situational anxiety 02/04/2015  . Lipoma 09/01/2011  . ALLERGIC RHINITIS, SEASONAL 05/05/2010  . ASTHMA, EXERCISE INDUCED 05/05/2010    Past Surgical History:  Procedure Laterality Date  . BREAST SURGERY  2004   cyst removed  . NECK SURGERY  2007   cyst removed from left side of neck  . TONSILLECTOMY      Prior to Admission medications   Medication Sig Start Date End Date Taking? Authorizing Provider  albuterol (PROAIR HFA) 108 (90 BASE) MCG/ACT inhaler Inhale 2 puffs into the lungs every 6 (six) hours as needed. Patient not taking: Reported on 06/18/2016 02/05/15   Jearld Fenton, NP  ALPRAZolam Duanne Moron) 0.25 MG tablet Take 1-2 tablets (0.25-0.5 mg total) by mouth daily as needed for anxiety. Patient not taking: Reported on 06/18/2016 08/26/15   Jinny Sanders, MD  loratadine (CLARITIN) 10 MG tablet Take 10 mg by mouth daily as needed.    [provider]  sulfamethoxazole-trimethoprim (BACTRIM DS,SEPTRA DS) 800-160 MG tablet Take 1 tablet by mouth 2 (two) times daily. 10/05/16 10/12/16  Lannie Fields, PA-C    Allergies Tylenol [acetaminophen]  Family History  Problem Relation Age of Onset  . Cancer Father        multiple myeloma  . Obesity Brother   . Stroke Mother   . Hypertension Mother   . Hyperlipidemia Mother   . Thyroid disease Mother   . Cancer Maternal Grandmother        breast  . Cancer Maternal Grandfather 93       colon   . ALS Paternal Grandfather     Social History Social History  Substance Use Topics  . Smoking status: Former Smoker    Packs/day: 1.00    Years: 4.00    Types: Cigarettes  . Smokeless tobacco: Former Systems developer    Quit date: 02/04/2007  . Alcohol use 0.6 oz/week    1 Glasses of wine per week     Comment: wine or beer nightly     Review of Systems  Constitutional: No fever/chills Eyes: No visual changes. No discharge ENT: No upper respiratory complaints. Cardiovascular: no chest pain. Respiratory: no cough. No SOB. Musculoskeletal: Negative for musculoskeletal pain. Skin: Patient has right thumb laceration.  Neurological: Negative for headaches, focal weakness or numbness.   ____________________________________________   PHYSICAL EXAM:  VITAL SIGNS: ED Triage Vitals  Enc Vitals Group     BP 10/05/16 2044 (!) 129/14     Pulse Rate 10/05/16 2044 71     Resp 10/05/16 2044 20     Temp 10/05/16 2044 99.2 F (37.3 C)  Temp Source 10/05/16 2044 Oral     SpO2 10/05/16 2044 100 %     Weight 10/05/16 2045 134 lb (60.8 kg)     Height 10/05/16 2045 _0  (1.626 m)     Head Circumference --      Peak Flow --      Pain Score 10/05/16 2044 4     Pain Loc --      Pain Edu? --      Excl. in [place]? --      Constitutional: Alert and oriented. Well appearing and in no acute distress. Eyes: Conjunctivae are normal. PERRL. EOMI. Head: Atraumatic. Cardiovascular: Normal rate, regular rhythm. Normal S1 and S2.  Good peripheral circulation. Respiratory: Normal respiratory effort without tachypnea or retractions. Lungs CTAB. Good  air entry to the bases with no decreased or absent breath sounds. Musculoskeletal: Full range of motion to all extremities. No gross deformities appreciated. Neurologic:  Normal speech and language. No gross focal neurologic deficits are appreciated.  Skin: She has 1-1/2 cm laceration of right thumb. Psychiatric: Mood and affect are normal. Speech and behavior are normal. Patient exhibits appropriate insight and judgement.   ____________________________________________   LABS (all labs ordered are listed, but only abnormal results are displayed)  Labs Reviewed - No data to display ____________________________________________  EKG   ____________________________________________  RADIOLOGY   No results found.  ____________________________________________    PROCEDURES  Procedure(s) performed:    Procedures    Medications  lidocaine (XYLOCAINE) 1 % (with pres) injection 5 mL (not administered)  lidocaine (PF) (XYLOCAINE) 1 % injection (not administered)   LACERATION REPAIR Performed by: Lannie Fields Authorized by: Lannie Fields Consent: Verbal consent obtained. Risks and benefits: risks, benefits and alternatives were discussed Consent given by: patient Patient identity confirmed: provided demographic data Prepped and Draped in normal sterile fashion Wound diffusely irrigated with 500 mL of normal saline  Laceration Location: Right thumb  Laceration Length: 1.5 cm  No Foreign Bodies seen or palpated  Anesthesia: local infiltration  Local anesthetic: lidocaine 1% without epinephrine  Anesthetic total: 3 ml  Irrigation method: syringe Amount of cleaning: standard  Skin closure: 5-0 Ethilon   Number of sutures: 4  Technique: Simple Interrupted   Patient tolerance: Patient tolerated the procedure well with no immediate complications.   ____________________________________________   INITIAL IMPRESSION / ASSESSMENT AND PLAN / ED  COURSE  Pertinent labs & imaging results that were available during my care of the patient were reviewed by me and considered in my medical decision making (see chart for details).  Review of the [place] CSRS was performed in accordance of the [place] prior to dispensing any controlled drugs.    Assessment and plan Laceration of right thumb Patient presents to the emergency department with a 1-1/2 cm laceration of the right thumb. Patient underwent laceration repair without complication. She was advised that sutures removed by primary care in 9 days. Patient was discharged with Bactrim as patient has a history of MRSA infection. Vitals were reassuring prior to discharge. All patient questions were answered.    ____________________________________________  FINAL CLINICAL IMPRESSION(S) / ED DIAGNOSES  Final diagnoses:  Laceration of right thumb without foreign body without damage to nail, initial encounter      NEW MEDICATIONS STARTED DURING THIS VISIT:  Discharge Medication List as of 10/05/2016 10:37 PM    START taking these medications   Details  sulfamethoxazole-trimethoprim (BACTRIM DS,SEPTRA DS) 800-160 MG tablet Take 1 tablet by mouth 2 (two)  times daily., Starting Tue 10/05/2016, Until Tue 10/12/2016, Print            This chart was dictated using voice recognition software/Dragon. Despite best efforts to proofread, errors can occur which can change the meaning. Any change was purely unintentional.    Lannie Fields, PA-C 10/05/16 2314    Carrie Mew, MD 10/05/16 [phone]

## 2016-10-05 NOTE — ED Notes (Signed)
See triage note..the patient reports to ED for laceration to R thumb. Bleeding controled at this time. Sensation, motor function and circulation intact.

## 2016-10-14 ENCOUNTER — Ambulatory Visit: Payer: Self-pay | Admitting: Adult Health

## 2016-10-14 ENCOUNTER — Encounter: Payer: Self-pay | Admitting: Adult Health

## 2016-10-14 VITALS — BP 115/65 | HR 83 | Temp 97.6°F | Resp 16

## 2016-10-14 DIAGNOSIS — Z4802 Encounter for removal of sutures: Secondary | ICD-10-CM

## 2016-10-14 NOTE — Patient Instructions (Signed)
Suture Removal, Care After Refer to this sheet in the next few weeks. These instructions provide you with information on caring for yourself after your procedure. Your health care provider may also give you more specific instructions. Your treatment has been planned according to current medical practices, but problems sometimes occur. Call your health care provider if you have any problems or questions after your procedure. What can I expect after the procedure? After your stitches (sutures) are removed, it is typical to have the following:  Some discomfort and swelling in the wound area.  Slight redness in the area.  Follow these instructions at home:  If you have skin adhesive strips over the wound area, do not take the strips off. They will fall off on their own in a few days. If the strips remain in place after 14 days, you may remove them.  Change any bandages (dressings) at least once a day or as directed by your health care provider. If the bandage sticks, soak it off with warm, soapy water.  Apply cream or ointment only as directed by your health care provider. If using cream or ointment, wash the area with soap and water 2 times a day to remove all the cream or ointment. Rinse off the soap and pat the area dry with a clean towel.  Keep the wound area dry and clean. If the bandage becomes wet or dirty, or if it develops a bad smell, change it as soon as possible.  Continue to protect the wound from injury.  Use sunscreen when out in the sun. New scars become sunburned easily. Contact a health care provider if:  You have increasing redness, swelling, or pain in the wound.  You see pus coming from the wound.  You have a fever.  You notice a bad smell coming from the wound or dressing.  Your wound breaks open (edges not staying together). This information is not intended to replace advice given to you by your health care provider. Make sure you discuss any questions you have  with your health care provider. Document Released: 12/01/2000 Document Revised: 08/14/2015 Document Reviewed: 10/18/2012 Elsevier Interactive Patient Education  2017 [employer] Sutures are stitches that can be used to close wounds. Taking care of your wound properly can help to prevent pain and infection. It can also help your wound to heal more quickly. How is this treated? Wound Care  Keep the wound clean and dry.  If you were given a bandage (dressing), you should change it at least once per day or as directed by your health care provider. You should also change it if it becomes wet or dirty.  Keep the wound completely dry for the first 24 hours or as directed by your health care provider. After that time, you may shower or bathe. However, make sure that the wound is not soaked in water until the sutures have been removed.  Clean the wound one time each day or as directed by your health care provider. ? Wash the wound with soap and water. ? Rinse the wound with water to remove all soap. ? Pat the wound dry with a clean towel. Do not rub the wound.  Aftercleaning the wound, apply a thin layer of antibioticointment as directed by your health care provider. This will help to prevent infection and keep the dressing from sticking to the wound.  Have the sutures removed as directed by your health care provider. General Instructions  Take or apply  medicines only as directed by your health care provider.  To help prevent scarring, make sure to cover your wound with sunscreen whenever you are outside after the sutures are removed and the wound is healed. Make sure to wear a sunscreen of at least 30 SPF.  If you were prescribed an antibiotic medicine or ointment, finish all of it even if you start to feel better.  Do not scratch or pick at the wound.  Keep all follow-up visits as directed by your health care provider. This is important.  Check your wound every day  for signs of infection. Watch for: ? Redness, swelling, or pain. ? Fluid, blood, or pus.  Raise (elevate) the injured area above the level of your heart while you are sitting or lying down, if possible.  Avoid stretching your wound.  Drink enough fluids to keep your urine clear or pale yellow. Contact a health care provider if:  You received a tetanus shot and you have swelling, severe pain, redness, or bleeding at the injection site.  You have a fever.  A wound that was closed breaks open.  You notice a bad smell coming from the wound.  You notice something coming out of the wound, such as wood or glass.  Your pain is not controlled with medicine.  You have increased redness, swelling, or pain at the site of your wound.  You have fluid, blood, or pus coming from your wound.  You notice a change in the color of your skin near your wound.  You need to change the dressing frequently due to fluid, blood, or pus draining from the wound.  You develop a new rash.  You develop numbness around the wound. Get help right away if:  You develop severe swelling around the injury site.  Your pain suddenly increases and is severe.  You develop painful lumps near the wound or on skin that is anywhere on your body.  You have a red streak going away from your wound.  The wound is on your hand or foot and you cannot properly move a finger or toe.  The wound is on your hand or foot and you notice that your fingers or toes look pale or bluish. This information is not intended to replace advice given to you by your health care provider. Make sure you discuss any questions you have with your health care provider. Document Released: 04/15/2004 Document Revised: 08/14/2015 Document Reviewed: 10/18/2012 Elsevier Interactive Patient Education  2017 [employer].

## 2016-10-14 NOTE — Progress Notes (Signed)
Right thumb removed stiches

## 2016-10-14 NOTE — Progress Notes (Signed)
Subjective:     Patient ID: Tonya Carrillo, female   DOB: Oct 13, 1980, 36 y.o.   MRN: [id number]  HPI Patient is a 36 year old female who presents to the clinic in no acute distress for removal of her stitches.   Right thumb stitches for a 1- 1/2 cm laceration with a knife while in her kitchen cooking to her thumb treated with three stiches  at [facility] ED on 10/05/16   She had mild bruising to the laceration that has resolved per her report. She finished prophylactic Bactrim finished yesterday. She denies any  signs of infection. Denies swelling, warmth, redness, radiculopathy, changes in sensation or weakness in the right arm or thumb. She does have a history of MRSA and was treated with Bactrim by ED for history.                           Vitals:   10/14/16 0922  BP: 115/65  Pulse: 83  Resp: 16  Temp: 97.6 F (36.4 C)    Review of Systems  Constitutional: Negative for activity change, appetite change, chills, diaphoresis, fatigue, fever and unexpected weight change.  HENT: Negative.   Eyes: Negative.   Respiratory: Negative.   Cardiovascular: Negative.   Gastrointestinal: Negative.   Endocrine: Negative.   Genitourinary: Negative.   Musculoskeletal: Negative.   Skin:       Right thumb three stiches, healed per patient.   Neurological: Negative.   Hematological: Negative.   Psychiatric/Behavioral: Negative.        Objective:   Physical Exam  Constitutional: She is oriented to person, place, and time. She appears well-developed and well-nourished. No distress.  HENT:  Head: Normocephalic and atraumatic.  Eyes: Pupils are equal, round, and reactive to light. Conjunctivae and EOM are normal. Right eye exhibits no discharge. Left eye exhibits no discharge. No scleral icterus.  Neck: Normal range of motion. Neck supple.  Cardiovascular: Normal rate, normal heart sounds and intact distal pulses.  Exam reveals no gallop and no friction rub.   No murmur  heard. Pulmonary/Chest: Effort normal and breath sounds normal.  Neurological: She is alert and oriented to person, place, and time. She has normal reflexes.  Skin: Skin is warm and dry. No rash noted. She is not diaphoretic. No erythema. No pallor.  Right thumb laceration, area cleaned, laceration  is closed, without erythema, warmth, drainage or pain.  Area cleaned before and after with Betadine.  3 stiches removed, no foreign body visualized. Patient tolerated procedure without any difficulty.Band aid applied to right thumb.        Assessment:     1. Suture Removal of right thumb      Plan:  1. Discussed signs and symptoms of infection with patient such as swelling, pain, redness, warmth or drainage at site. Discussed keep area clean and dry. Patient will return to the clinic or urgent care of any symptoms change or worsen. Patient verbalized understanding of all above instructions.

## 2016-12-17 ENCOUNTER — Other Ambulatory Visit: Payer: Self-pay

## 2016-12-17 NOTE — Telephone Encounter (Signed)
Pt left v/m requesting refill Xanax; pt flying next week. Last refilled # 15 on 08/26/15. Pt last seen 02/04/15 and no future appt scheduled.Please advise. [facility] [address]

## 2016-12-20 NOTE — Telephone Encounter (Signed)
Patient left another message requesting a refill on her Xanax.

## 2016-12-21 ENCOUNTER — Ambulatory Visit: Payer: Self-pay | Admitting: Medical

## 2016-12-21 ENCOUNTER — Encounter: Payer: Self-pay | Admitting: Medical

## 2016-12-21 VITALS — BP 110/70 | HR 63 | Temp 99.5°F | Resp 16 | Ht 64.5 in | Wt 132.0 lb

## 2016-12-21 DIAGNOSIS — J45990 Exercise induced bronchospasm: Secondary | ICD-10-CM

## 2016-12-21 DIAGNOSIS — R197 Diarrhea, unspecified: Secondary | ICD-10-CM

## 2016-12-21 MED ORDER — ALBUTEROL SULFATE HFA 108 (90 BASE) MCG/ACT IN AERS: 2.0000 | INHALATION_SPRAY | Freq: Four times a day (QID) | RESPIRATORY_TRACT | 1 refills | Status: DC | PRN

## 2016-12-21 NOTE — Telephone Encounter (Signed)
Pt has not been seen in 2 years.. Needs an appt for refills.

## 2016-12-21 NOTE — Progress Notes (Signed)
Subjective:    Patient ID: Tonya Carrillo, female    DOB: [birth year], 36 y.o.   MRN: [id number]  HPI 36 yo female in non acute distress presents today with complaints of  2.5 weeks of diarrhea  initially thought it was a food, then she thought it was stress , got married on Sept 16th then had  on/off diarrhea and felt better. Cannot tell if it is a food she is eating. " Super busy at work" but denied stress.  Taking calming tea with magnesium and no relief. Today seems better, but did not eat much yesterday, decrease appetite. Still working out , running , cross training or yoga.Started with anxiety with work and the [employer] and now leaving Thursday for conference, [place] returns 4 days. Having diarrhea to soft stools   2 x /day.   Review of Systems  Constitutional: Positive for appetite change, chills and diaphoresis. Negative for activity change and fever.  HENT: Positive for postnasal drip and sore throat (thought it was post nasal drip end of august and beginining of sept.). Negative for ear pain, rhinorrhea and sinus pain.   Eyes: Negative.  Negative for discharge.  Respiratory: Positive for chest tightness (gets this around hormonal time periods).   Cardiovascular: Negative.   Gastrointestinal: Positive for diarrhea. Negative for abdominal distention, abdominal pain, anal bleeding, blood in stool, constipation, nausea, rectal pain and vomiting.  Endocrine: Positive for polydipsia. Negative for polyphagia and polyuria.  Genitourinary: Negative for decreased urine volume, dysuria, frequency, hematuria and urgency.  Musculoskeletal: Negative.   Skin: Negative.   Allergic/Immunologic: Positive for environmental allergies and food allergies.  Neurological: Positive for dizziness, weakness (last couple of days.) and light-headedness.  Hematological: Negative.   Psychiatric/Behavioral: The patient is nervous/anxious.    Feels she is not eating enough.     Objective:   Physical Exam  Constitutional: She is oriented to person, place, and time. She appears well-developed and well-nourished.  HENT:  Head: Normocephalic and atraumatic.  Right Ear: External ear normal.  Left Ear: External ear normal.  Mouth/Throat: Oropharynx is clear and moist.  Eyes: Pupils are equal, round, and reactive to light. Conjunctivae and EOM are normal.  Neck: Normal range of motion. Neck supple.  Cardiovascular: Normal rate, regular rhythm and normal heart sounds.   Pulmonary/Chest: Effort normal and breath sounds normal.  Abdominal: Soft. Bowel sounds are normal. She exhibits no distension and no mass. There is no tenderness. There is no rebound and no guarding.  Neurological: She is alert and oriented to person, place, and time.  Skin: Skin is warm and dry.  Psychiatric: She has a normal mood and affect. Her behavior is normal. Judgment and thought content normal.  Nursing note and vitals reviewed.  Sitting calmly on exam table.       Assessment & Plan:  Diarrhea x  2 weeks. 1-2 x times per day.  Will check stool culture,  Ova and Parasite/  Fecal occcult blood. May take OTC Imodium to see if it will stop.Recommended OTC Gatorade for electrolyte replacement.  Will contact patient with results. Patient asked for refill on expired medication of Xanax and Albuterol . Recommended she call her doctor for refill on Xanax. She has some but it expired in June.  Refilled Albuterol MDI.   Meds ordered this encounter  Medications  . albuterol (PROAIR HFA) 108 (90 Base) MCG/ACT inhaler    Sig: Inhale 2 puffs into the lungs every 6 (six) hours as needed.  Dispense:  1 Inhaler    Refill:  1

## 2016-12-21 NOTE — Telephone Encounter (Signed)
Left message for Tonya Carrillo that refill has been denied due to she has not been seen in 2 years. Will need an office visit prior to refill.

## 2016-12-22 ENCOUNTER — Ambulatory Visit (INDEPENDENT_AMBULATORY_CARE_PROVIDER_SITE_OTHER): Payer: BLUE CROSS/BLUE SHIELD | Admitting: Primary Care

## 2016-12-22 VITALS — BP 100/64 | HR 80 | Temp 98.4°F | Ht 64.5 in | Wt 131.4 lb

## 2016-12-22 DIAGNOSIS — F418 Other specified anxiety disorders: Secondary | ICD-10-CM | POA: Diagnosis not present

## 2016-12-22 MED ORDER — ALPRAZOLAM 0.25 MG PO TABS: ORAL_TABLET | ORAL | 0 refills | Status: DC

## 2016-12-22 NOTE — Progress Notes (Signed)
 Subjective:    Patient ID: Tonya Carrillo, female    DOB: 04/10/1980, 36 y.o.   MRN: [id number]  HPI  Tonya Carrillo is a 36 year old female with a history of situational anxiety who presents today for medication refill.  She is requesting a refill of her alprazolam for which she uses when flying only. She experiences panic attacks when flying. She will take one tablet 20-30 minutes prior to boarding which will last throughout her entire trip. She doesn't typically have to take alprazolam during layovers. She has her last bottle from 1 year ago which has 10 tablets remaining out of 15. She takes around 3-4 trips annually that require flights.  She does experience daily nervousness but does well to manage on her own. She does not take the alprazolam other than for flights.  Review of Systems  Respiratory: Negative for shortness of breath.   Cardiovascular: Negative for chest pain.  Psychiatric/Behavioral:       See HPI       Past Medical History:  Diagnosis Date  . Asthma   . GERD (gastroesophageal reflux disease)   . History of chicken pox   . UTI (lower urinary tract infection)      Social History   Social History  . Marital status: Married    Spouse name: N/A  . Number of children: 0  . Years of education: N/A   Occupational History  . [employer]   Social History Main Topics  . Smoking status: Former Smoker    Packs/day: 1.00    Years: 4.00    Types: Cigarettes  . Smokeless tobacco: Former User    Quit date: 02/04/2007  . Alcohol use 0.6 oz/week    1 Glasses of wine per week     Comment: wine or beer nightly  . Drug use: No  . Sexual activity: Yes    Birth control/ protection: Condom   Other Topics Concern  . Not on file   Social History Narrative   Diet Vegetarian.. Eats a lot of protein limits sweets      Regular exercise running, weights, yoga    Past Surgical History:  Procedure Laterality Date  . BREAST SURGERY  2004   cyst removed  . NECK SURGERY  2007   cyst removed from left side of neck  . TONSILLECTOMY      Family History  Problem Relation Age of Onset  . Cancer Father        multiple myeloma  . Obesity Brother   . Stroke Mother   . Hypertension Mother   . Hyperlipidemia Mother   . Thyroid disease Mother   . Cancer Maternal Grandmother        breast  . Cancer Maternal Grandfather 71       colon   . ALS Paternal Grandfather     Allergies  Allergen Reactions  . Other     Almond had swelling in throat and mouth itching, no respiratory  . Tylenol [Acetaminophen] Cough    Current Outpatient Prescriptions on File Prior to Visit  Medication Sig Dispense Refill  . albuterol (PROAIR HFA) 108 (90 Base) MCG/ACT inhaler Inhale 2 puffs into the lungs every 6 (six) hours as needed. 1 Inhaler 1  . loratadine (CLARITIN) 10 MG tablet Take 10 mg by mouth daily as needed.     No current facility-administered medications on file prior to visit.     BP 100/64   Pulse 80     Temp 98.4 F (36.9 C) (Oral)   Ht 5' 4.5" (1.638 m)   Wt 131 lb 6.4 oz (59.6 kg)   LMP 12/03/2016   SpO2 98%   BMI 22.21 kg/m    Objective:   Physical Exam  Constitutional: She appears well-nourished.  Neck: Neck supple.  Cardiovascular: Normal rate and regular rhythm.   Pulmonary/Chest: Effort normal and breath sounds normal.  Skin: Skin is warm and dry.  Psychiatric: She has a normal mood and affect.          Assessment & Plan:   

## 2016-12-22 NOTE — Assessment & Plan Note (Signed)
Using alprazolam only prior to flights. Appropriate use.  Refill provided today as she will be traveling 3-4 times between now and January 2019.

## 2016-12-22 NOTE — Patient Instructions (Signed)
We have refilled your alprazolam. You may take 1 tablet by mouth 20 minutes prior to all flights.  It was a pleasure meeting you!

## 2017-11-02 DIAGNOSIS — D2261 Melanocytic nevi of right upper limb, including shoulder: Secondary | ICD-10-CM | POA: Diagnosis not present

## 2017-11-02 DIAGNOSIS — D225 Melanocytic nevi of trunk: Secondary | ICD-10-CM | POA: Diagnosis not present

## 2017-11-02 DIAGNOSIS — D2272 Melanocytic nevi of left lower limb, including hip: Secondary | ICD-10-CM | POA: Diagnosis not present

## 2017-11-02 DIAGNOSIS — D2262 Melanocytic nevi of left upper limb, including shoulder: Secondary | ICD-10-CM | POA: Diagnosis not present

## 2017-12-26 ENCOUNTER — Telehealth: Payer: Self-pay | Admitting: Medical

## 2017-12-26 NOTE — Telephone Encounter (Signed)
Called patient to see how she is doing and to see if she is /has taken in the stool samples. Left message to return my call.

## 2017-12-28 ENCOUNTER — Telehealth: Payer: Self-pay | Admitting: Medical

## 2017-12-28 NOTE — Telephone Encounter (Signed)
Called patient on  12/26/17 , she had not turned I her stool cultures and other stool tests.  She states she is better and diarrhea resolved in 2 days, so she did not turn in the stool samples.   She feels fine. Return to the clinic as needed.  She verbalizes understanding and has no questions at the end of our conversation.

## 2018-04-17 ENCOUNTER — Encounter: Payer: Self-pay | Admitting: Medical

## 2018-04-17 ENCOUNTER — Ambulatory Visit: Payer: Self-pay | Admitting: Medical

## 2018-04-17 VITALS — BP 127/81 | HR 81 | Temp 101.1°F | Resp 16 | Wt 133.2 lb

## 2018-04-17 DIAGNOSIS — J0100 Acute maxillary sinusitis, unspecified: Secondary | ICD-10-CM

## 2018-04-17 DIAGNOSIS — H6502 Acute serous otitis media, left ear: Secondary | ICD-10-CM

## 2018-04-17 MED ORDER — AMOXICILLIN-POT CLAVULANATE 875-125 MG PO TABS: 1.0000 | ORAL_TABLET | Freq: Two times a day (BID) | ORAL | 0 refills | Status: DC

## 2018-04-17 NOTE — Patient Instructions (Signed)
Otitis Media, Adult  Otitis media means that the middle ear is red and swollen (inflamed) and full of fluid. The condition usually goes away on its own. Follow these instructions at home:  Take over-the-counter and prescription medicines only as told by your doctor.  If you were prescribed an antibiotic medicine, take it as told by your doctor. Do not stop taking the antibiotic even if you start to feel better.  Keep all follow-up visits as told by your doctor. This is important. Contact a doctor if:  You have bleeding from your nose.  There is a lump on your neck.  You are not getting better in 5 days.  You feel worse instead of better. Get help right away if:  You have pain that is not helped with medicine.  You have swelling, redness, or pain around your ear.  You get a stiff neck.  You cannot move part of your face (paralyzed).  You notice that the bone behind your ear hurts when you touch it.  You get a very bad headache. Summary  Otitis media means that the middle ear is red, swollen, and full of fluid.  This condition usually goes away on its own. In some cases, treatment may be needed.  If you were prescribed an antibiotic medicine, take it as told by your doctor. This information is not intended to replace advice given to you by your health care provider. Make sure you discuss any questions you have with your health care provider. Document Released: 08/25/2007 Document Revised: 03/29/2016 Document Reviewed: 03/29/2016 Elsevier Interactive Patient Education  2019 Elsevier Inc. Sinusitis, Adult Sinusitis is soreness and swelling (inflammation) of your sinuses. Sinuses are hollow spaces in the bones around your face. They are located:  Around your eyes.  In the middle of your forehead.  Behind your nose.  In your cheekbones. Your sinuses and nasal passages are lined with a fluid called mucus. Mucus drains out of your sinuses. Swelling can trap mucus in your  sinuses. This lets germs (bacteria, virus, or fungus) grow, which leads to infection. Most of the time, this condition is caused by a virus. What are the causes? This condition is caused by:  Allergies.  Asthma.  Germs.  Things that block your nose or sinuses.  Growths in the nose (nasal polyps).  Chemicals or irritants in the air.  Fungus (rare). What increases the risk? You are more likely to develop this condition if:  You have a weak body defense system (immune system).  You do a lot of swimming or diving.  You use nasal sprays too much.  You smoke. What are the signs or symptoms? The main symptoms of this condition are pain and a feeling of pressure around the sinuses. Other symptoms include:  Stuffy nose (congestion).  Runny nose (drainage).  Swelling and warmth in the sinuses.  Headache.  Toothache.  A cough that may get worse at night.  Mucus that collects in the throat or the back of the nose (postnasal drip).  Being unable to smell and taste.  Being very tired (fatigue).  A fever.  Sore throat.  Bad breath. How is this diagnosed? This condition is diagnosed based on:  Your symptoms.  Your medical history.  A physical exam.  Tests to find out if your condition is short-term (acute) or long-term (chronic). Your doctor may: ? Check your nose for growths (polyps). ? Check your sinuses using a tool that has a light (endoscope). ? Check for allergies or   germs. ? Do imaging tests, such as an MRI or CT scan. How is this treated? Treatment for this condition depends on the cause and whether it is short-term or long-term.  If caused by a virus, your symptoms should go away on their own within 10 days. You may be given medicines to relieve symptoms. They include: ? Medicines that shrink swollen tissue in the nose. ? Medicines that treat allergies (antihistamines). ? A spray that treats swelling of the nostrils. ? Rinses that help get rid of  thick mucus in your nose (nasal saline washes).  If caused by bacteria, your doctor may wait to see if you will get better without treatment. You may be given antibiotic medicine if you have: ? A very bad infection. ? A weak body defense system.  If caused by growths in the nose, you may need to have surgery. Follow these instructions at home: Medicines  Take, use, or apply over-the-counter and prescription medicines only as told by your doctor. These may include nasal sprays.  If you were prescribed an antibiotic medicine, take it as told by your doctor. Do not stop taking the antibiotic even if you start to feel better. Hydrate and humidify   Drink enough water to keep your pee (urine) pale yellow.  Use a cool mist humidifier to keep the humidity level in your home above 50%.  Breathe in steam for 10-15 minutes, 3-4 times a day, or as told by your doctor. You can do this in the bathroom while a hot shower is running.  Try not to spend time in cool or dry air. Rest  Rest as much as you can.  Sleep with your head raised (elevated).  Make sure you get enough sleep each night. General instructions   Put a warm, moist washcloth on your face 3-4 times a day, or as often as told by your doctor. This will help with discomfort.  Wash your hands often with soap and water. If there is no soap and water, use hand sanitizer.  Do not smoke. Avoid being around people who are smoking (secondhand smoke).  Keep all follow-up visits as told by your doctor. This is important. Contact a doctor if:  You have a fever.  Your symptoms get worse.  Your symptoms do not get better within 10 days. Get help right away if:  You have a very bad headache.  You cannot stop throwing up (vomiting).  You have very bad pain or swelling around your face or eyes.  You have trouble seeing.  You feel confused.  Your neck is stiff.  You have trouble breathing. Summary  Sinusitis is swelling of  your sinuses. Sinuses are hollow spaces in the bones around your face.  This condition is caused by tissues in your nose that become inflamed or swollen. This traps germs. These can lead to infection.  If you were prescribed an antibiotic medicine, take it as told by your doctor. Do not stop taking it even if you start to feel better.  Keep all follow-up visits as told by your doctor. This is important. This information is not intended to replace advice given to you by your health care provider. Make sure you discuss any questions you have with your health care provider. Document Released: 08/25/2007 Document Revised: 08/08/2017 Document Reviewed: 08/08/2017 Elsevier Interactive Patient Education  2019 Elsevier Inc.  

## 2018-04-17 NOTE — Progress Notes (Signed)
Subjective:    Patient ID: Tonya Carrillo, female    DOB: 1980-09-09, 38 y.o.   MRN: [id number]  HPI  2 weeks ago st and then 2 days later runny started clear, no body aches or temperature.  The following weekend nasal discharge yellow.  Ear pressure, fullness. Hears popping but getting it better. Yesterday felt fatigued, today  Hands and feet could.  Taking  Snex NS  and vapor rub and ibuprofen. Blood pressure 127/81, pulse 81, temperature (!) 101.1 F (38.4 C), temperature source Tympanic, resp. rate 16, weight 133 lb 3.2 oz (60.4 kg), SpO2 100 %.   Allergies  Allergen Reactions  . Other     Almond had swelling in throat and mouth itching, no respiratory  . Tylenol [Acetaminophen] Cough    Review of Systems  Constitutional: Positive for appetite change (decreased), chills (last Saturday and today), fatigue and fever. Negative for activity change.  HENT: Positive for congestion, postnasal drip, rhinorrhea, sinus pressure, sinus pain (maxillary with pain in teeth) and sore throat (continued). Negative for ear discharge, ear pain, sneezing and trouble swallowing.   Eyes: Negative for discharge and itching.  Respiratory: Negative for cough, chest tightness and shortness of breath.   Cardiovascular: Negative for chest pain.  Gastrointestinal: Negative for abdominal pain.  Endocrine: Negative for polydipsia, polyphagia and polyuria.  Genitourinary: Negative for dysuria.  Musculoskeletal: Negative for myalgias.  Skin: Negative for rash.  Allergic/Immunologic: Positive for environmental allergies and food allergies.  Neurological: Positive for headaches (one day last week, took ibuprofen). Negative for dizziness, syncope and light-headedness.  Hematological: Positive for adenopathy.  Psychiatric/Behavioral: Negative for behavioral problems, confusion, self-injury and suicidal ideas.   some of relux with caffeine and wine.    Hx of MRSA 2005 Objective:   Physical Exam Vitals  signs and nursing note reviewed.  Constitutional:      Appearance: Normal appearance.  HENT:     Head: Normocephalic and atraumatic.     Jaw: There is normal jaw occlusion.     Right Ear: Hearing, ear canal and external ear normal. A middle ear effusion is present.     Left Ear: Hearing, ear canal and external ear normal. Tympanic membrane is erythematous.     Nose: Congestion present. No rhinorrhea.     Right Turbinates: Not enlarged or swollen.     Left Turbinates: Enlarged. Not swollen.     Mouth/Throat:     Lips: Pink.     Mouth: Mucous membranes are moist.     Pharynx: Posterior oropharyngeal erythema (mild) present. No pharyngeal swelling, oropharyngeal exudate or uvula swelling.     Tonsils: Swelling: 1+ on the right. 1+ on the left.  Eyes:     Extraocular Movements: Extraocular movements intact.     Conjunctiva/sclera: Conjunctivae normal.     Pupils: Pupils are equal, round, and reactive to light.  Neck:     Musculoskeletal: Normal range of motion and neck supple.  Cardiovascular:     Rate and Rhythm: Normal rate and regular rhythm.     Heart sounds: Normal heart sounds.  Pulmonary:     Effort: Pulmonary effort is normal.     Breath sounds: Normal breath sounds.  Musculoskeletal: Normal range of motion.  Lymphadenopathy:     Cervical: Cervical adenopathy present.  Skin:    General: Skin is warm and dry.  Neurological:     General: No focal deficit present.     Mental Status: She is alert and oriented to person,  place, and time.  Psychiatric:        Mood and Affect: Mood normal.        Behavior: Behavior normal.        Thought Content: Thought content normal.        Judgment: Judgment normal.    Clearing  Throat in room . Cobble stoning of posterior pharynx     Assessment & Plan:  Sinusitis maxillary Otitis media left Declines Ibuprofen in clinic says she lives 5 minutes away.  Meds ordered this encounter  Medications  . amoxicillin-clavulanate  (AUGMENTIN) 875-125 MG tablet    Sig: Take 1 tablet by mouth 2 (two) times daily.    Dispense:  20 tablet    Refill:  0  return in 3-5 days if not improving. Rest and increase fluids or gatorade. Patient verbalizes understanding and has no questions at discharge.

## 2018-04-25 ENCOUNTER — Encounter: Payer: Self-pay | Admitting: Medical

## 2018-04-25 ENCOUNTER — Ambulatory Visit: Payer: Self-pay | Admitting: Medical

## 2018-04-25 VITALS — BP 122/86 | HR 65 | Temp 97.9°F | Resp 16 | Wt 132.2 lb

## 2018-04-25 DIAGNOSIS — Z0000 Encounter for general adult medical examination without abnormal findings: Secondary | ICD-10-CM

## 2018-04-25 NOTE — Progress Notes (Signed)
   Subjective:    Patient ID: Tonya Carrillo, female    DOB: [birth year], 38 y.o.   MRN: [id number]  HPI  38 yo female in non acute distress. Sundayy got out of bed at night and felt a pop and pain  in the arch of the  left foot. Tender on Monday , , better now. No pain now.  Ran this morning  2 miles and yoga last night.  Denies numbness or tingling.  History of stress fracture in the left 5th metatarsal  Jan 2018. No pain with walking on it.   Even though she felt better she did not want to cancel appointment because she thought it would be rude. Blood pressure 122/86, pulse 65, temperature 97.9 F (36.6 C), temperature source Tympanic, resp. rate 16, weight 132 lb 3.2 oz (60 kg), SpO2 100 %. Allergies  Allergen Reactions  . Omega 3-6-9 Fatty Acids [Omega-3 Fusion]     swelliing with throat and dry cough  . Other     Almond had swelling in throat and mouth itching, no respiratory  . Tylenol [Acetaminophen] Cough     Review of Systems  Constitutional: Negative for chills and fatigue.  HENT: Positive for rhinorrhea (clear). Negative for congestion, ear discharge and sore throat.    Feeling much better then last visit diagnosed with sinusitis and otitis media. Taking Augmentin     Objective:   Physical Exam Cardiovascular:     Pulses:          Dorsalis pedis pulses are 2+ on the left side.       Posterior tibial pulses are 2+ on the left side.  Musculoskeletal:     Left foot: Normal range of motion. No deformity, bunion, Charcot foot, foot drop or prominent metatarsal heads.  Feet:     Left foot:     Skin integrity: Skin integrity normal. No ulcer, blister, skin breakdown, erythema, warmth, callus, dry skin or fissure.     Toenail Condition: Left toenails are normal.   non tender with palpation of arch or 5th metatarsal. Gait wnl CR<2s        Assessment & Plan:  Left foot pain. Resolved. Return if any concerns. Patient verbalizes understanding and has no  questions at discharge.

## 2018-07-01 IMAGING — CR DG FOOT COMPLETE 3+V*L*
1 series · 3 of 3 positions shown · non-contrast
Comparison: None.

CLINICAL DATA: Pain while running

EXAM:
LEFT FOOT - COMPLETE 3+ VIEW

[Series 1: dg foot complete left · 0.14mm/px · 3 of 3 slices shown]
[im 1/3]
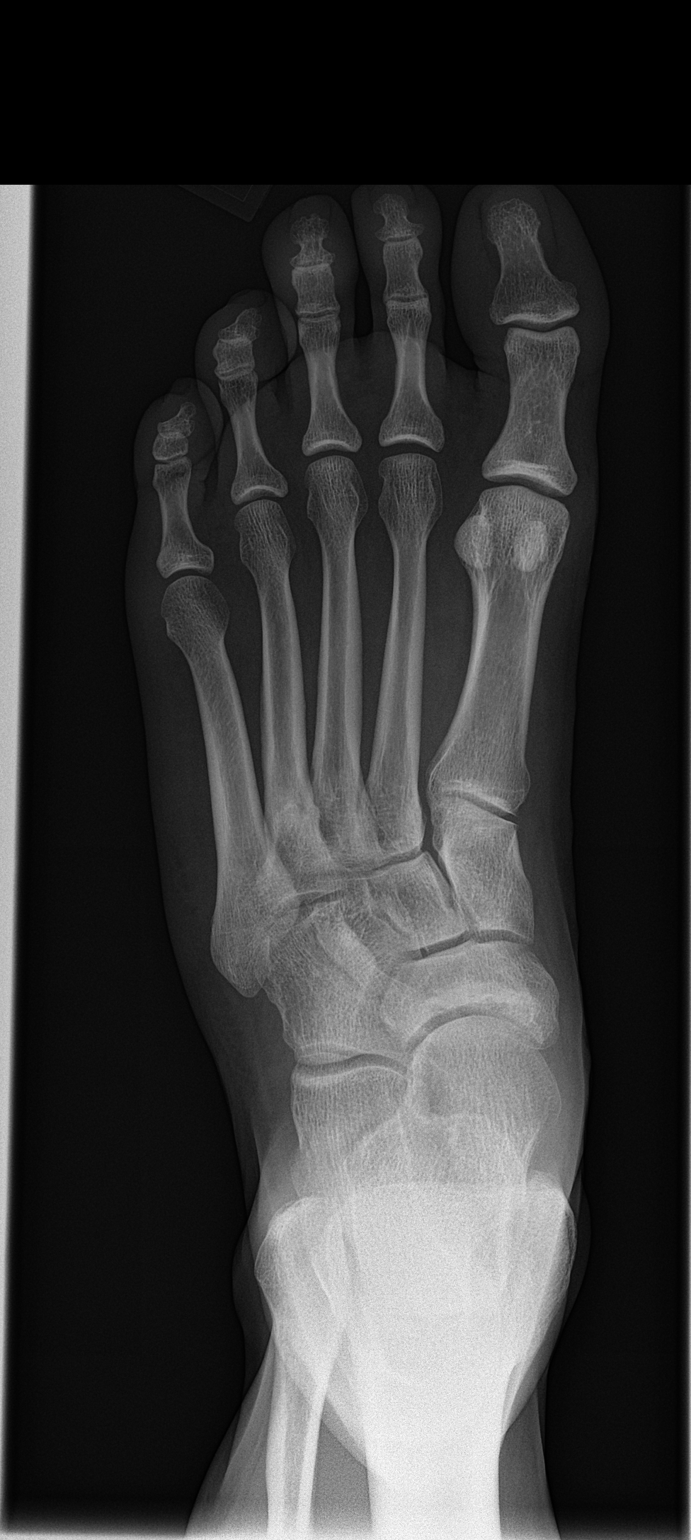
[im 2/3]
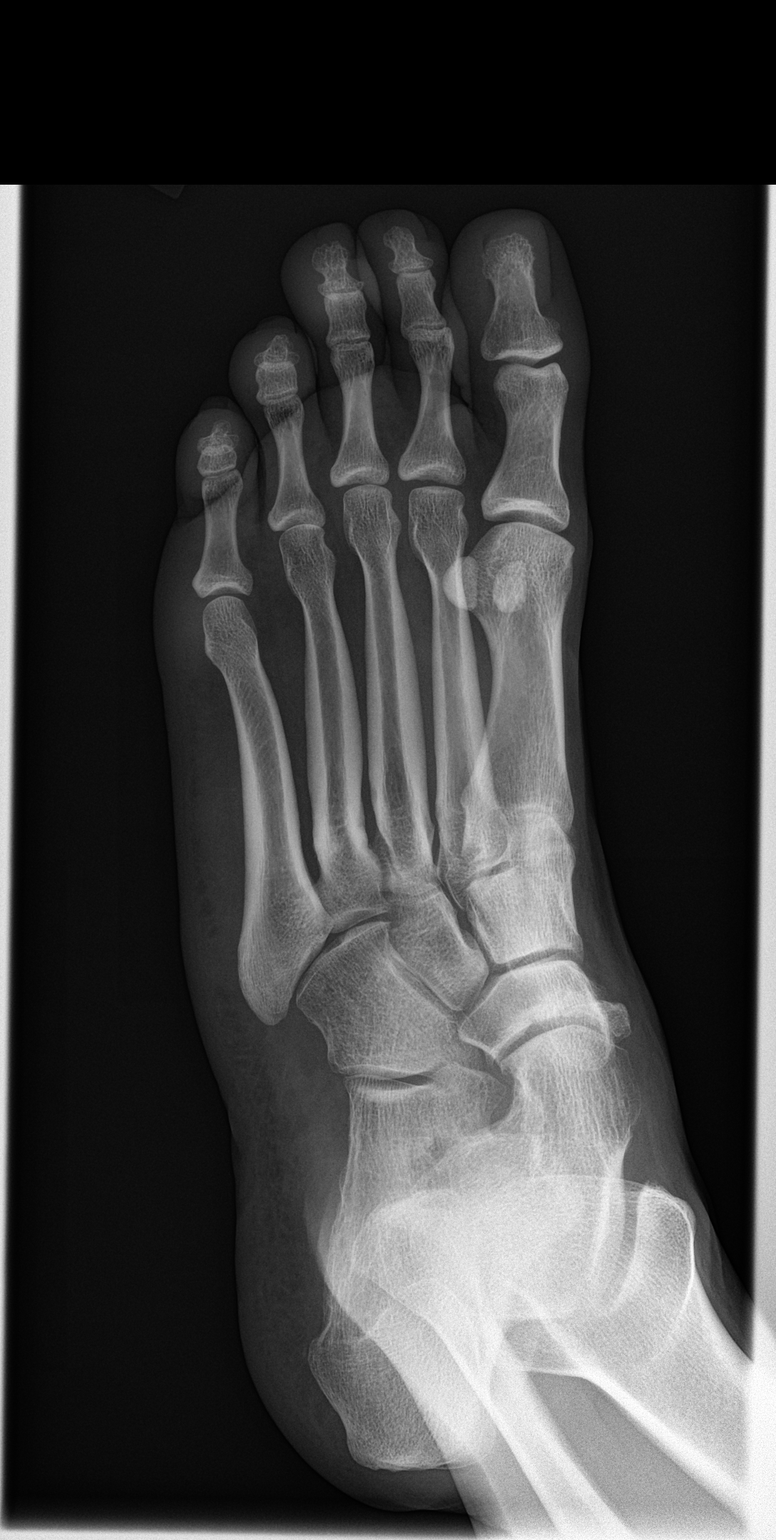
[im 3/3]
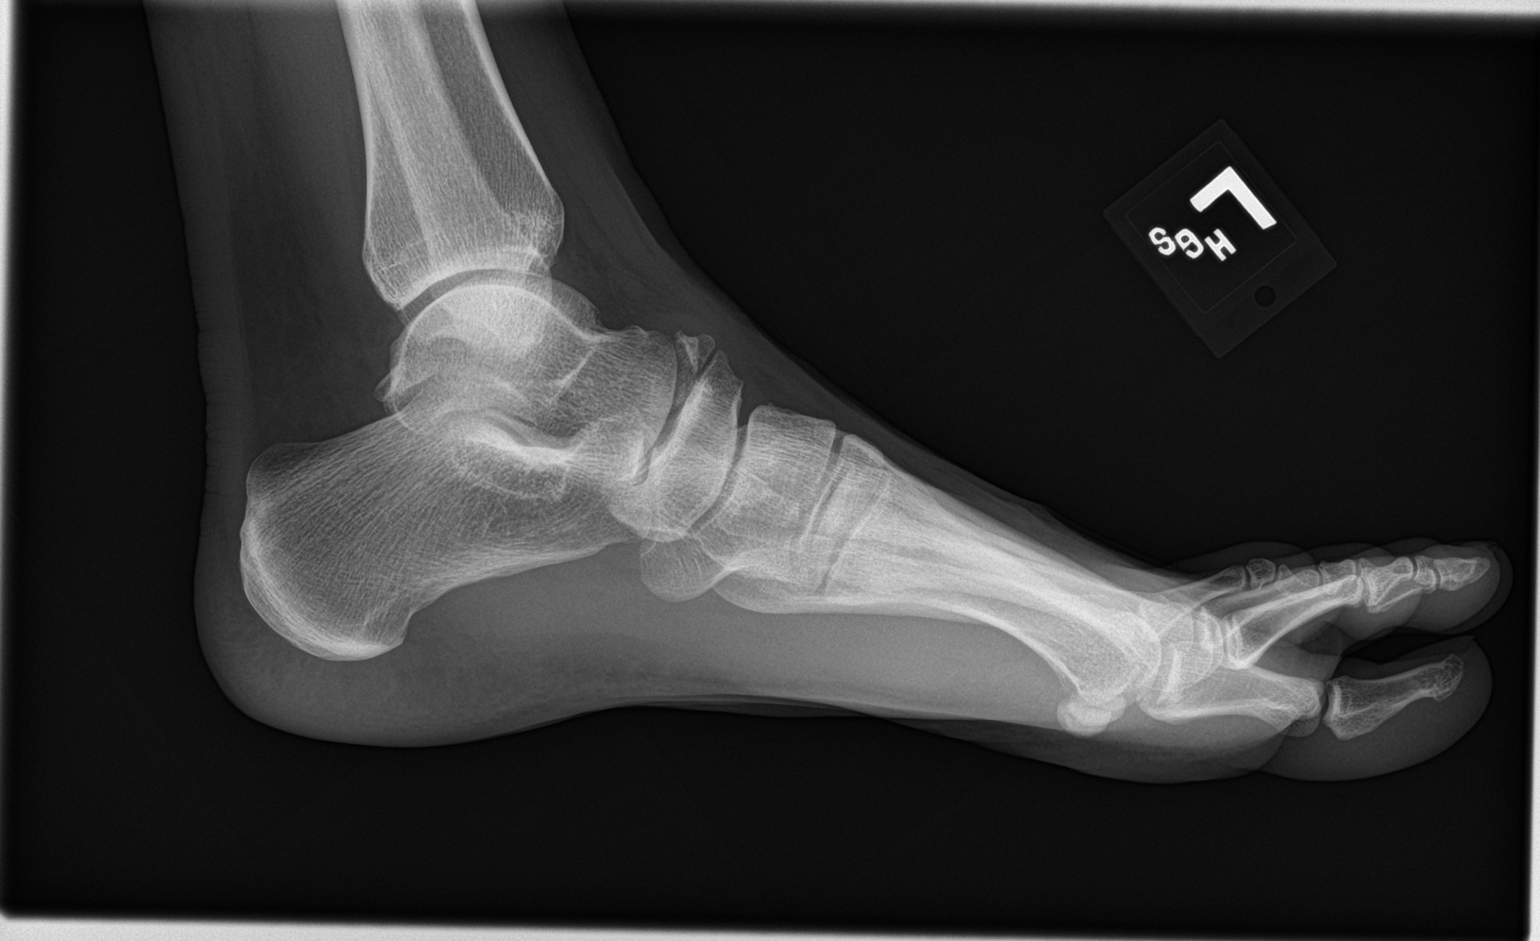

[3 of 3 positions shown; findings below may reference images not displayed]

FINDINGS: Frontal, oblique, and lateral views were obtained. There is
localized sclerosis in the proximal fourth metatarsal, likely a
stress type fracture. Alignment appears anatomic in this area. No
other evident fracture. No dislocation. Joint spaces appear normal.
There is calcification between the talus and navicular dorsally
which may represent residual of old trauma.
IMPRESSION: Suspect stress type fracture proximal fourth metatarsal with
alignment essentially anatomic. No other fracture. No dislocation.
Suspect old trauma with remodeling between the distal talus and
proximal navicular dorsally. No appreciable arthropathy.

These results will be called to the ordering clinician or
representative by the Radiologist Assistant, and communication
documented in the PACS or zVision Dashboard.

## 2018-08-22 ENCOUNTER — Ambulatory Visit (INDEPENDENT_AMBULATORY_CARE_PROVIDER_SITE_OTHER): Payer: BC Managed Care – PPO | Admitting: Family Medicine

## 2018-08-22 ENCOUNTER — Encounter: Payer: Self-pay | Admitting: Family Medicine

## 2018-08-22 DIAGNOSIS — J45990 Exercise induced bronchospasm: Secondary | ICD-10-CM | POA: Diagnosis not present

## 2018-08-22 DIAGNOSIS — F418 Other specified anxiety disorders: Secondary | ICD-10-CM | POA: Diagnosis not present

## 2018-08-22 MED ORDER — ALBUTEROL SULFATE HFA 108 (90 BASE) MCG/ACT IN AERS
2.0000 | INHALATION_SPRAY | Freq: Four times a day (QID) | RESPIRATORY_TRACT | 0 refills | Status: DC | PRN
Start: 2018-08-22 — End: 2020-11-11

## 2018-08-22 MED ORDER — ALPRAZOLAM 0.25 MG PO TABS: ORAL_TABLET | ORAL | 0 refills | Status: DC

## 2018-08-22 NOTE — Assessment & Plan Note (Signed)
Continue stress reduction and relaxation. She is looking into counseling to help with dealing with mothers current illness.  Prescribed alprazolam to use for plane flights.

## 2018-08-22 NOTE — Progress Notes (Signed)
VIRTUAL VISIT Due to national recommendations of social distancing due to [place] 19, a virtual visit is felt to be most appropriate for this patient at this time.   I connected with the patient on 08/22/18 at  9:40 AM EDT by virtual telehealth platform and verified that I am speaking with the correct person using two identifiers.   I discussed the limitations, risks, security and privacy concerns of performing an evaluation and management service by  virtual telehealth platform and the availability of in person appointments. I also discussed with the patient that there may be a patient responsible charge related to this service. The patient expressed understanding and agreed to proceed.  Patient location: Home Provider Location: [facility] [place] Participants: Tonya Carrillo and [place]   Chief Complaint  Patient presents with  . Medication Refill    Alprazolam and Inhaler    History of Present Illness:  38 year old female presents for follow up on situational anxiety and asthma  Situational anxiety: She is requesting a refill of her alprazolam for which she uses when flying only. She experiences panic attacks when flying. She will take one tablet 10 minutes prior to boarding which will last throughout her entire trip. She doesn't typically have to take alprazolam during layovers. She has her last bottle from 1 year ago which has 10 tablets remaining out of 15. She takes around 3-4 trips annually that require flights.  She has 8-9 tabs left.. She does have some increased stress.mOther with cancer.. unknown primary.. likely bladder. She drinks alot of calming. GAD 7 : Generalized Anxiety Score 08/22/2018  Nervous, Anxious, on Edge 1  Control/stop worrying 0  Worry too much - different things 0  Trouble relaxing 2  Restless 2  Easily annoyed or irritable 1  Afraid - awful might happen 0  Total GAD 7 Score 6  Anxiety Difficulty Not difficult at all   Depression screen [facility]  2/9 08/22/2018  Decreased Interest 0  Down, Depressed, Hopeless 0  PHQ - 2 Score 0  Altered sleeping 1  Tired, decreased energy 0  Change in appetite 0  Feeling bad or failure about yourself  0  Trouble concentrating 0  Moving slowly or fidgety/restless 0  Suicidal thoughts 0  PHQ-9 Score 1  Difficult doing work/chores Not difficult at all    She does experience daily nervousness but does well to manage on her own. She does not take the alprazolam other than for flights.  ASTHMA.Marland Kitchen exercise induced Symptoms are well controlled: She does not use her inhaler... just carry it with her on runs. Using medications without problems: Night time symptoms:none Wheeze/SOB:none ER visits since last visit:none Missed work or school:none Allergens:  Occ.. does not need to use antihistamine...rarely using.    COVID 19 screen No recent travel or known exposure to COVID19 The patient denies respiratory symptoms of COVID 19 at this time.  The importance of social distancing was discussed today.   Review of Systems  Constitutional: Negative for chills and fever.  HENT: Negative for congestion and ear pain.   Eyes: Negative for pain and redness.  Respiratory: Negative for cough and shortness of breath.   Cardiovascular: Negative for chest pain, palpitations and leg swelling.  Gastrointestinal: Negative for abdominal pain, blood in stool, constipation, diarrhea, nausea and vomiting.  Genitourinary: Negative for dysuria.  Musculoskeletal: Negative for falls and myalgias.  Skin: Negative for rash.  Neurological: Negative for dizziness.  Psychiatric/Behavioral: Negative for depression. The patient is not  nervous/anxious.       Past Medical History:  Diagnosis Date  . Asthma   . GERD (gastroesophageal reflux disease)   . History of chicken pox   . UTI (lower urinary tract infection)     reports that she has quit smoking. Her smoking use included cigarettes. She has a 4.00 pack-year smoking  history. She quit smokeless tobacco use about 11 years ago. She reports current alcohol use of about 1.0 standard drinks of alcohol per week. She reports that she does not use drugs.   Current Outpatient Medications:  .  albuterol (PROAIR HFA) 108 (90 Base) MCG/ACT inhaler, Inhale 2 puffs into the lungs every 6 (six) hours as needed., Disp: 1 Inhaler, Rfl: 1 .  ALPRAZolam (XANAX) 0.25 MG tablet, Take 1 tablet by mouth 20 minutes prior to flight as needed for anxiety., Disp: 15 tablet, Rfl: 0   Observations/Objective: Pulse (!) 57, weight 125 lb (56.7 kg), last menstrual period 07/30/2018.  Physical Exam  Physical Exam Constitutional:      General: The patient is not in acute distress. Pulmonary:     Effort: Pulmonary effort is normal. No respiratory distress.  Neurological:     Mental Status: The patient is alert and oriented to person, place, and time.  Psychiatric:        Mood and Affect: Mood normal.        Behavior: Behavior normal.   Assessment and Plan Exercise-induced asthma Rare issues.. Refilled albuterol to use prn.  Situational anxiety Continue stress reduction and relaxation. She is looking into counseling to help with dealing with mothers current illness.  Prescribed alprazolam to use for plane flights.     She does CPX through [facility]. Was unable ot go this year.. weill set up later in year.  I discussed the assessment and treatment plan with the patient. The patient was provided an opportunity to ask questions and all were answered. The patient agreed with the plan and demonstrated an understanding of the instructions.   The patient was advised to call back or seek an in-person evaluation if the symptoms worsen or if the condition fails to improve as anticipated.     Tonya Lofts, MD

## 2018-08-22 NOTE — Assessment & Plan Note (Signed)
Rare issues.. Refilled albuterol to use prn.

## 2018-08-24 IMAGING — MR MR FOOT*L* W/O CM
4 of 6 series · 26 of 40 positions shown · non-contrast
Comparison: None.

CLINICAL DATA: Lateral midfoot pain with running. Ongoing for 2
months.

EXAM:
MRI OF THE LEFT FOOT WITHOUT CONTRAST
TECHNIQUE: Multiplanar, multisequence MR imaging of the left foot was
performed. No intravenous contrast was administered.

[Series 3: T1 · coronal · 4.0mm · 0.75mm/px · 6 of 37 slices shown]
[im 1/37]
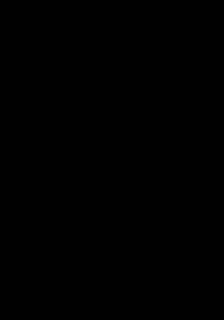
[im 5/37]
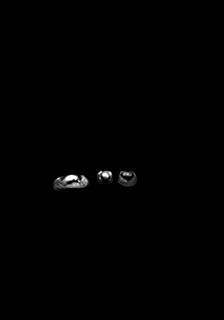
[im 10/37]
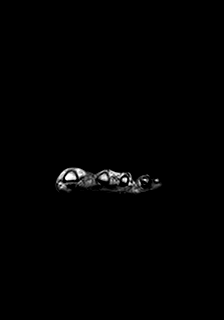
[im 14/37]
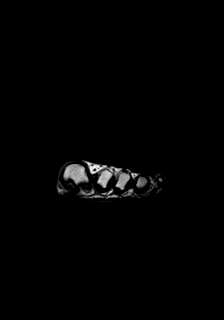
[im 19/37]
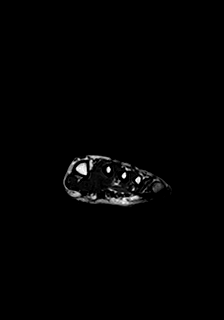
[im 32/37]
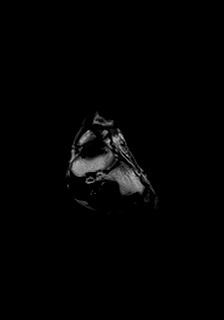

[Series 4: T2 fat-sat · coronal · 4.0mm · 0.43mm/px · 8 of 35 slices shown (1 of 3)]
[im 1/35]
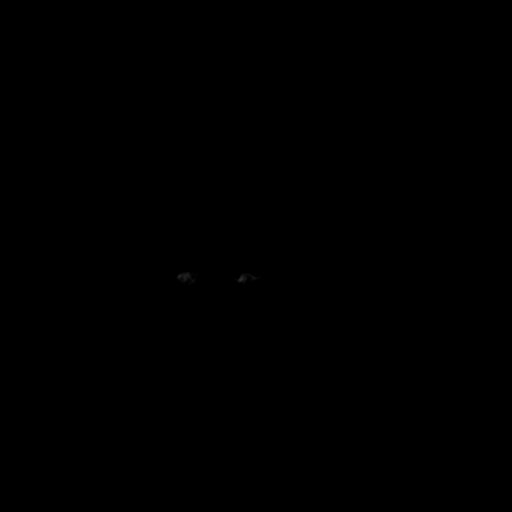
[im 5/35]
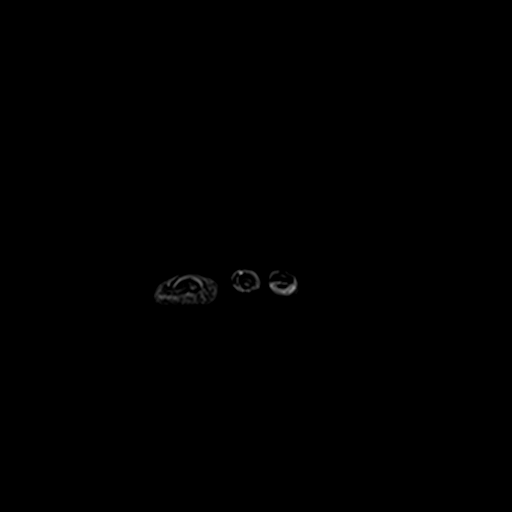
[im 10/35]
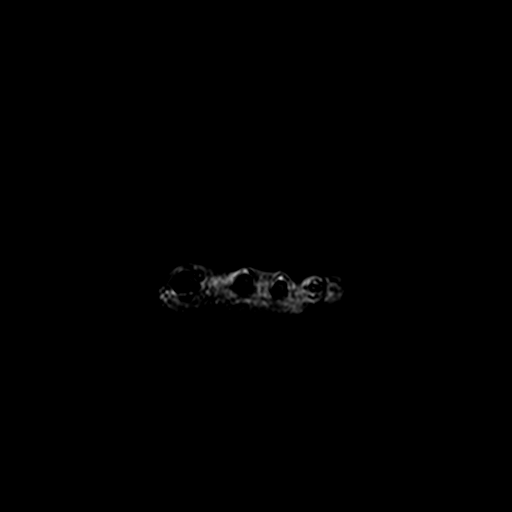
[im 15/35]
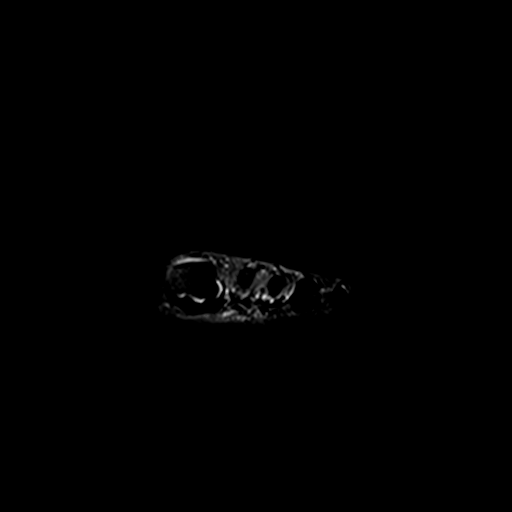
[im 20/35]
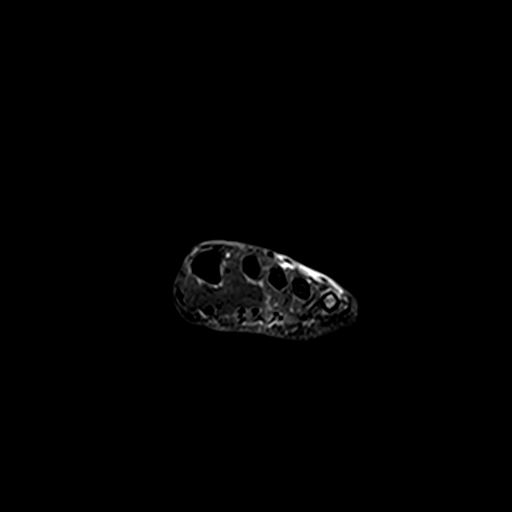
[im 25/35]
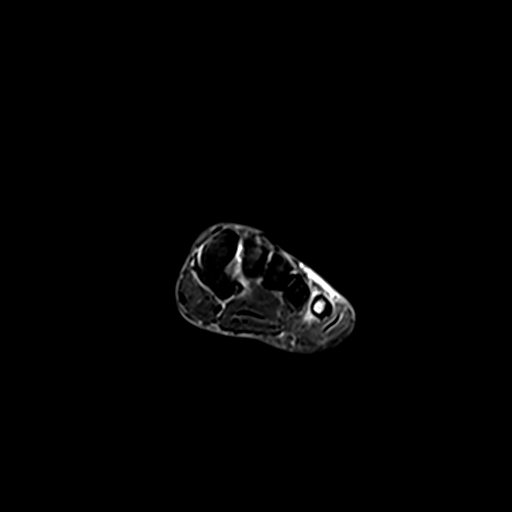
[im 30/35]
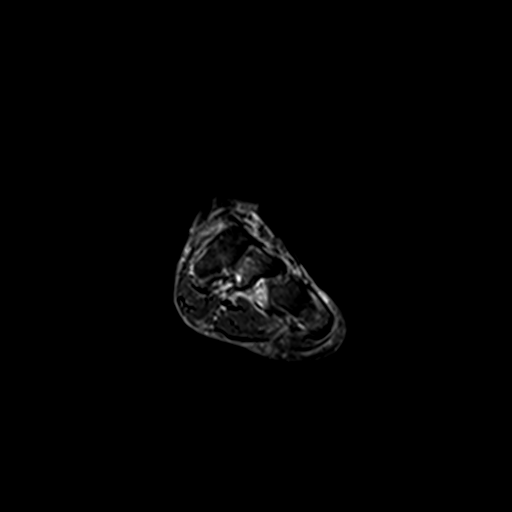
[im 35/35]
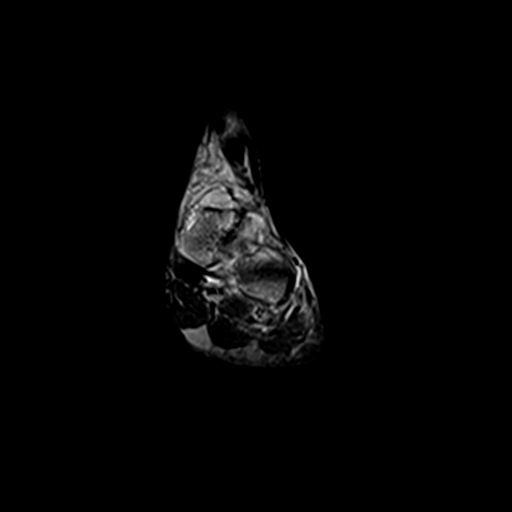

[Series 6: T2 fat-sat · axial · 3.0mm · 0.60mm/px · z∈[-10,+68]mm · 6 of 25 slices shown (2 of 3)]
[im 1/25]
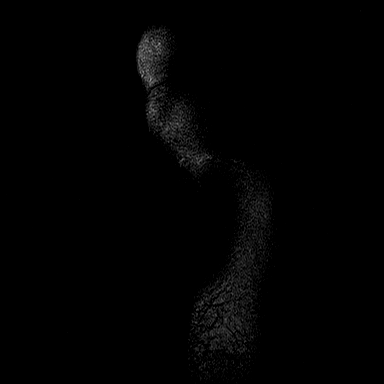
[im 5/25]
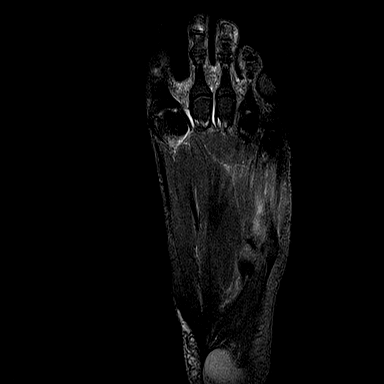
[im 10/25]
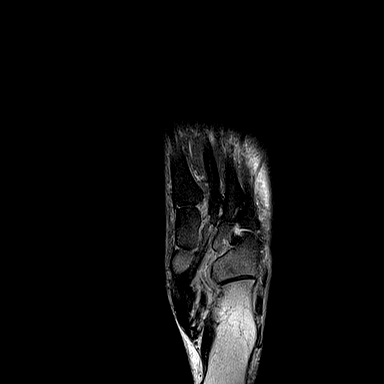
[im 15/25]
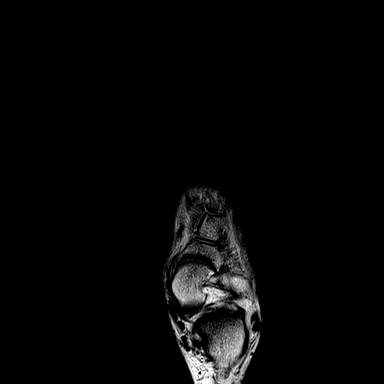
[im 20/25]
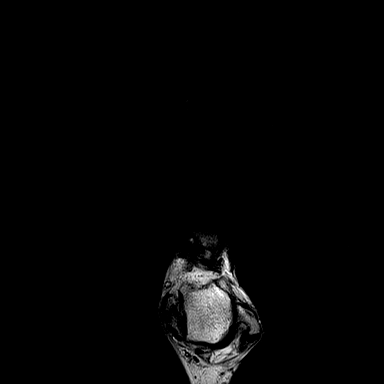
[im 25/25]
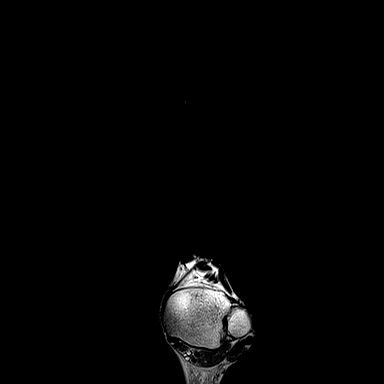

[Series 7: T2 fat-sat · sagittal · 3.0mm · 0.47mm/px · 6 of 26 slices shown (3 of 3)]
[im 1/26]
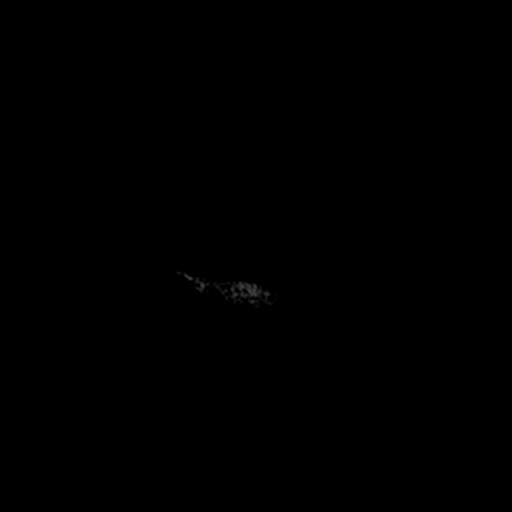
[im 6/26]
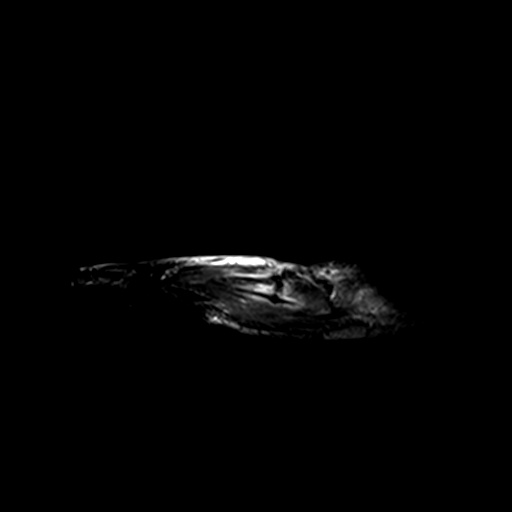
[im 11/26]
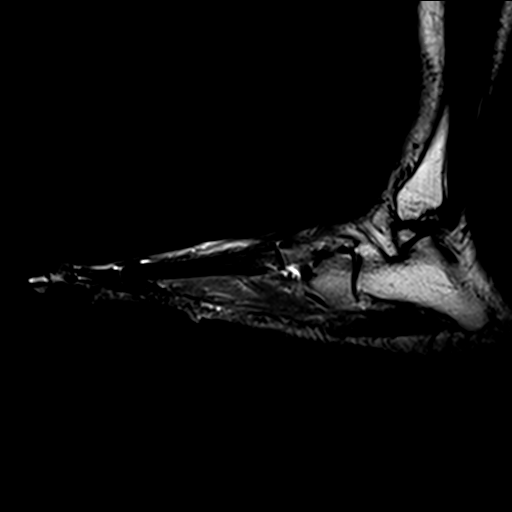
[im 16/26]
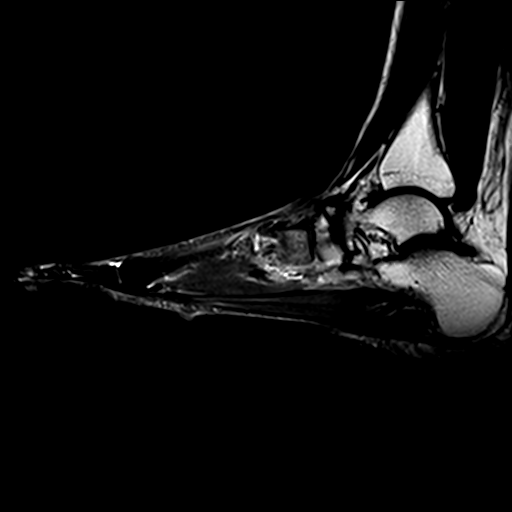
[im 21/26]
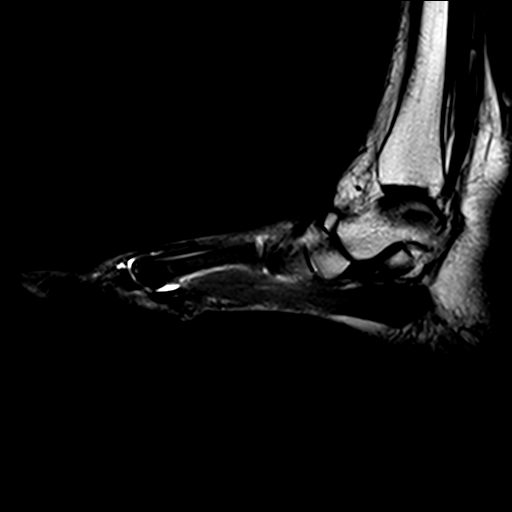
[im 26/26]
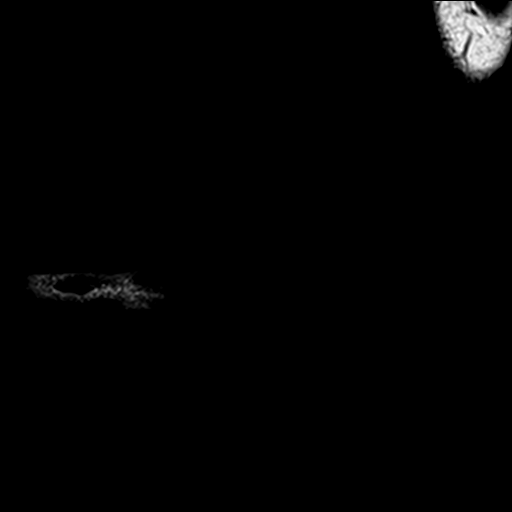

[26 of 40 positions shown; findings below may reference images not displayed]

FINDINGS: TENDONS

Peroneal: Peroneal longus tendon intact. Peroneal brevis intact.

Posteromedial: Posterior tibial tendon intact. Flexor hallucis
longus tendon intact. Flexor digitorum longus tendon intact.

Anterior: Tibialis anterior tendon intact. Extensor hallucis longus
tendon intact Extensor digitorum longus tendon intact.

Achilles:  Intact.

Plantar Fascia: Intact.

LIGAMENTS

Lateral: Anterior talofibular ligament intact. Calcaneofibular
ligament intact. Posterior talofibular ligament intact. Anterior and
posterior tibiofibular ligaments intact.

Medial: Deltoid ligament intact. Spring ligament intact.

Lisfranc: Intact.

CARTILAGE

Ankle Joint: No joint effusion. Normal ankle mortise. No chondral
defect.

Subtalar Joints/Sinus Tarsi: Normal subtalar joints. No subtalar
joint effusion. Normal sinus tarsi.

Bones: Severe bone marrow edema in the fifth metatarsal shaft with a
linear low signal component involving the proximal fifth metatarsal
shaft most consistent with a stress fracture. No other marrow signal
abnormality. No other acute fracture or dislocation. Moderate-severe
osteoarthritis of the talonavicular joint.

Soft Tissue: No fluid collection or hematoma.  No soft tissue mass.
IMPRESSION: 1. Acute nondisplaced stress fracture of the proximal fifth
metatarsal diaphysis with severe surrounding marrow edema.

## 2019-01-05 ENCOUNTER — Other Ambulatory Visit: Payer: Self-pay

## 2019-01-05 ENCOUNTER — Ambulatory Visit: Payer: Self-pay

## 2019-01-05 DIAGNOSIS — Z23 Encounter for immunization: Secondary | ICD-10-CM

## 2019-03-20 DIAGNOSIS — Z124 Encounter for screening for malignant neoplasm of cervix: Secondary | ICD-10-CM | POA: Diagnosis not present

## 2019-03-20 DIAGNOSIS — Z01419 Encounter for gynecological examination (general) (routine) without abnormal findings: Secondary | ICD-10-CM | POA: Diagnosis not present

## 2019-03-20 DIAGNOSIS — R300 Dysuria: Secondary | ICD-10-CM | POA: Diagnosis not present

## 2019-03-20 DIAGNOSIS — Z1331 Encounter for screening for depression: Secondary | ICD-10-CM | POA: Diagnosis not present

## 2019-05-16 DIAGNOSIS — Z3169 Encounter for other general counseling and advice on procreation: Secondary | ICD-10-CM | POA: Diagnosis not present

## 2019-08-07 DIAGNOSIS — N912 Amenorrhea, unspecified: Secondary | ICD-10-CM | POA: Diagnosis not present

## 2019-09-11 DIAGNOSIS — Z114 Encounter for screening for human immunodeficiency virus [HIV]: Secondary | ICD-10-CM | POA: Diagnosis not present

## 2019-09-11 DIAGNOSIS — Z113 Encounter for screening for infections with a predominantly sexual mode of transmission: Secondary | ICD-10-CM | POA: Diagnosis not present

## 2019-09-11 DIAGNOSIS — O09511 Supervision of elderly primigravida, first trimester: Secondary | ICD-10-CM | POA: Diagnosis not present

## 2019-09-11 DIAGNOSIS — Z3401 Encounter for supervision of normal first pregnancy, first trimester: Secondary | ICD-10-CM | POA: Diagnosis not present

## 2019-09-19 DIAGNOSIS — O09529 Supervision of elderly multigravida, unspecified trimester: Secondary | ICD-10-CM | POA: Diagnosis not present

## 2019-09-19 DIAGNOSIS — Z3A13 13 weeks gestation of pregnancy: Secondary | ICD-10-CM | POA: Diagnosis not present

## 2019-09-19 DIAGNOSIS — Z3A12 12 weeks gestation of pregnancy: Secondary | ICD-10-CM | POA: Diagnosis not present

## 2019-09-19 DIAGNOSIS — O09511 Supervision of elderly primigravida, first trimester: Secondary | ICD-10-CM | POA: Diagnosis not present

## 2019-09-19 DIAGNOSIS — O09521 Supervision of elderly multigravida, first trimester: Secondary | ICD-10-CM | POA: Diagnosis not present

## 2019-10-02 DIAGNOSIS — Z8279 Family history of other congenital malformations, deformations and chromosomal abnormalities: Secondary | ICD-10-CM | POA: Diagnosis not present

## 2019-10-02 DIAGNOSIS — Z1379 Encounter for other screening for genetic and chromosomal anomalies: Secondary | ICD-10-CM | POA: Diagnosis not present

## 2019-10-03 DIAGNOSIS — Z8279 Family history of other congenital malformations, deformations and chromosomal abnormalities: Secondary | ICD-10-CM | POA: Insufficient documentation

## 2019-10-04 DIAGNOSIS — Z8279 Family history of other congenital malformations, deformations and chromosomal abnormalities: Secondary | ICD-10-CM | POA: Diagnosis not present

## 2019-10-04 DIAGNOSIS — Z0000 Encounter for general adult medical examination without abnormal findings: Secondary | ICD-10-CM | POA: Diagnosis not present

## 2019-10-10 DIAGNOSIS — O281 Abnormal biochemical finding on antenatal screening of mother: Secondary | ICD-10-CM | POA: Diagnosis not present

## 2019-10-10 DIAGNOSIS — O09522 Supervision of elderly multigravida, second trimester: Secondary | ICD-10-CM | POA: Diagnosis not present

## 2019-10-10 DIAGNOSIS — Z3A15 15 weeks gestation of pregnancy: Secondary | ICD-10-CM | POA: Diagnosis not present

## 2019-10-10 DIAGNOSIS — O09512 Supervision of elderly primigravida, second trimester: Secondary | ICD-10-CM | POA: Diagnosis not present

## 2019-10-25 DIAGNOSIS — R768 Other specified abnormal immunological findings in serum: Secondary | ICD-10-CM | POA: Diagnosis not present

## 2019-10-25 DIAGNOSIS — Q917 Trisomy 13, unspecified: Secondary | ICD-10-CM | POA: Diagnosis not present

## 2019-10-25 DIAGNOSIS — Z332 Encounter for elective termination of pregnancy: Secondary | ICD-10-CM | POA: Diagnosis not present

## 2019-10-26 DIAGNOSIS — O351XX0 Maternal care for (suspected) chromosomal abnormality in fetus, not applicable or unspecified: Secondary | ICD-10-CM | POA: Diagnosis not present

## 2019-10-26 DIAGNOSIS — Z332 Encounter for elective termination of pregnancy: Secondary | ICD-10-CM | POA: Diagnosis not present

## 2019-10-26 DIAGNOSIS — O280 Abnormal hematological finding on antenatal screening of mother: Secondary | ICD-10-CM | POA: Diagnosis not present

## 2019-10-26 DIAGNOSIS — Q998 Other specified chromosome abnormalities: Secondary | ICD-10-CM | POA: Diagnosis not present

## 2019-10-26 DIAGNOSIS — Z79899 Other long term (current) drug therapy: Secondary | ICD-10-CM | POA: Diagnosis not present

## 2019-10-26 DIAGNOSIS — Z3A17 17 weeks gestation of pregnancy: Secondary | ICD-10-CM | POA: Diagnosis not present

## 2019-10-26 DIAGNOSIS — Z87891 Personal history of nicotine dependence: Secondary | ICD-10-CM | POA: Diagnosis not present

## 2019-10-26 DIAGNOSIS — J45909 Unspecified asthma, uncomplicated: Secondary | ICD-10-CM | POA: Diagnosis not present

## 2019-10-26 DIAGNOSIS — Z8279 Family history of other congenital malformations, deformations and chromosomal abnormalities: Secondary | ICD-10-CM | POA: Diagnosis not present

## 2019-10-26 DIAGNOSIS — Z7982 Long term (current) use of aspirin: Secondary | ICD-10-CM | POA: Diagnosis not present

## 2019-11-13 DIAGNOSIS — Z09 Encounter for follow-up examination after completed treatment for conditions other than malignant neoplasm: Secondary | ICD-10-CM | POA: Diagnosis not present

## 2020-02-07 DIAGNOSIS — Z1159 Encounter for screening for other viral diseases: Secondary | ICD-10-CM | POA: Diagnosis not present

## 2020-02-07 DIAGNOSIS — Z87891 Personal history of nicotine dependence: Secondary | ICD-10-CM | POA: Diagnosis not present

## 2020-02-07 DIAGNOSIS — Z8279 Family history of other congenital malformations, deformations and chromosomal abnormalities: Secondary | ICD-10-CM | POA: Diagnosis not present

## 2020-02-07 DIAGNOSIS — O0991 Supervision of high risk pregnancy, unspecified, first trimester: Secondary | ICD-10-CM | POA: Diagnosis not present

## 2020-02-07 DIAGNOSIS — Z6791 Unspecified blood type, Rh negative: Secondary | ICD-10-CM | POA: Insufficient documentation

## 2020-02-07 DIAGNOSIS — O26899 Other specified pregnancy related conditions, unspecified trimester: Secondary | ICD-10-CM | POA: Insufficient documentation

## 2020-02-07 DIAGNOSIS — J45909 Unspecified asthma, uncomplicated: Secondary | ICD-10-CM | POA: Diagnosis not present

## 2020-02-07 DIAGNOSIS — Z3A08 8 weeks gestation of pregnancy: Secondary | ICD-10-CM | POA: Diagnosis not present

## 2020-02-07 DIAGNOSIS — O99519 Diseases of the respiratory system complicating pregnancy, unspecified trimester: Secondary | ICD-10-CM | POA: Diagnosis not present

## 2020-02-07 DIAGNOSIS — O99511 Diseases of the respiratory system complicating pregnancy, first trimester: Secondary | ICD-10-CM | POA: Diagnosis not present

## 2020-02-07 DIAGNOSIS — O09521 Supervision of elderly multigravida, first trimester: Secondary | ICD-10-CM | POA: Diagnosis not present

## 2020-02-07 DIAGNOSIS — Z148 Genetic carrier of other disease: Secondary | ICD-10-CM | POA: Insufficient documentation

## 2020-02-22 DIAGNOSIS — Z148 Genetic carrier of other disease: Secondary | ICD-10-CM | POA: Diagnosis not present

## 2020-02-22 DIAGNOSIS — O09521 Supervision of elderly multigravida, first trimester: Secondary | ICD-10-CM | POA: Diagnosis not present

## 2020-02-22 DIAGNOSIS — Z6791 Unspecified blood type, Rh negative: Secondary | ICD-10-CM | POA: Diagnosis not present

## 2020-02-22 DIAGNOSIS — O26891 Other specified pregnancy related conditions, first trimester: Secondary | ICD-10-CM | POA: Diagnosis not present

## 2020-02-22 DIAGNOSIS — Z9229 Personal history of other drug therapy: Secondary | ICD-10-CM | POA: Diagnosis not present

## 2020-02-22 DIAGNOSIS — Z3A11 11 weeks gestation of pregnancy: Secondary | ICD-10-CM | POA: Diagnosis not present

## 2020-02-22 DIAGNOSIS — Z8279 Family history of other congenital malformations, deformations and chromosomal abnormalities: Secondary | ICD-10-CM | POA: Diagnosis not present

## 2020-04-17 DIAGNOSIS — Z3A18 18 weeks gestation of pregnancy: Secondary | ICD-10-CM | POA: Diagnosis not present

## 2020-04-17 DIAGNOSIS — O09292 Supervision of pregnancy with other poor reproductive or obstetric history, second trimester: Secondary | ICD-10-CM | POA: Diagnosis not present

## 2020-04-17 DIAGNOSIS — O99512 Diseases of the respiratory system complicating pregnancy, second trimester: Secondary | ICD-10-CM | POA: Diagnosis not present

## 2020-04-17 DIAGNOSIS — Z3689 Encounter for other specified antenatal screening: Secondary | ICD-10-CM | POA: Diagnosis not present

## 2020-04-17 DIAGNOSIS — O26892 Other specified pregnancy related conditions, second trimester: Secondary | ICD-10-CM | POA: Diagnosis not present

## 2020-04-17 DIAGNOSIS — O09522 Supervision of elderly multigravida, second trimester: Secondary | ICD-10-CM | POA: Diagnosis not present

## 2020-04-17 DIAGNOSIS — Z7982 Long term (current) use of aspirin: Secondary | ICD-10-CM | POA: Diagnosis not present

## 2020-04-17 DIAGNOSIS — J45909 Unspecified asthma, uncomplicated: Secondary | ICD-10-CM | POA: Diagnosis not present

## 2020-04-17 DIAGNOSIS — Z6791 Unspecified blood type, Rh negative: Secondary | ICD-10-CM | POA: Diagnosis not present

## 2020-05-19 DIAGNOSIS — Z3689 Encounter for other specified antenatal screening: Secondary | ICD-10-CM | POA: Diagnosis not present

## 2020-05-19 DIAGNOSIS — O09522 Supervision of elderly multigravida, second trimester: Secondary | ICD-10-CM | POA: Diagnosis not present

## 2020-05-19 DIAGNOSIS — Z3A23 23 weeks gestation of pregnancy: Secondary | ICD-10-CM | POA: Diagnosis not present

## 2020-06-23 DIAGNOSIS — Z6791 Unspecified blood type, Rh negative: Secondary | ICD-10-CM | POA: Diagnosis not present

## 2020-06-23 DIAGNOSIS — J45909 Unspecified asthma, uncomplicated: Secondary | ICD-10-CM | POA: Diagnosis not present

## 2020-06-23 DIAGNOSIS — R21 Rash and other nonspecific skin eruption: Secondary | ICD-10-CM | POA: Diagnosis not present

## 2020-06-23 DIAGNOSIS — O26893 Other specified pregnancy related conditions, third trimester: Secondary | ICD-10-CM | POA: Diagnosis not present

## 2020-06-23 DIAGNOSIS — Z7982 Long term (current) use of aspirin: Secondary | ICD-10-CM | POA: Diagnosis not present

## 2020-06-23 DIAGNOSIS — O09293 Supervision of pregnancy with other poor reproductive or obstetric history, third trimester: Secondary | ICD-10-CM | POA: Diagnosis not present

## 2020-06-23 DIAGNOSIS — O09523 Supervision of elderly multigravida, third trimester: Secondary | ICD-10-CM | POA: Diagnosis not present

## 2020-06-23 DIAGNOSIS — Z23 Encounter for immunization: Secondary | ICD-10-CM | POA: Diagnosis not present

## 2020-06-23 DIAGNOSIS — Z3689 Encounter for other specified antenatal screening: Secondary | ICD-10-CM | POA: Diagnosis not present

## 2020-06-23 DIAGNOSIS — Z3A28 28 weeks gestation of pregnancy: Secondary | ICD-10-CM | POA: Diagnosis not present

## 2020-06-23 DIAGNOSIS — O99513 Diseases of the respiratory system complicating pregnancy, third trimester: Secondary | ICD-10-CM | POA: Diagnosis not present

## 2020-06-23 DIAGNOSIS — O99891 Other specified diseases and conditions complicating pregnancy: Secondary | ICD-10-CM | POA: Diagnosis not present

## 2020-06-23 DIAGNOSIS — Z8279 Family history of other congenital malformations, deformations and chromosomal abnormalities: Secondary | ICD-10-CM | POA: Diagnosis not present

## 2020-06-23 DIAGNOSIS — O0993 Supervision of high risk pregnancy, unspecified, third trimester: Secondary | ICD-10-CM | POA: Diagnosis not present

## 2020-07-17 DIAGNOSIS — T63301A Toxic effect of unspecified spider venom, accidental (unintentional), initial encounter: Secondary | ICD-10-CM | POA: Diagnosis not present

## 2020-07-23 DIAGNOSIS — J45909 Unspecified asthma, uncomplicated: Secondary | ICD-10-CM | POA: Diagnosis not present

## 2020-07-23 DIAGNOSIS — Z8279 Family history of other congenital malformations, deformations and chromosomal abnormalities: Secondary | ICD-10-CM | POA: Diagnosis not present

## 2020-07-23 DIAGNOSIS — Z6791 Unspecified blood type, Rh negative: Secondary | ICD-10-CM | POA: Diagnosis not present

## 2020-07-23 DIAGNOSIS — O09523 Supervision of elderly multigravida, third trimester: Secondary | ICD-10-CM | POA: Diagnosis not present

## 2020-07-23 DIAGNOSIS — O99891 Other specified diseases and conditions complicating pregnancy: Secondary | ICD-10-CM | POA: Diagnosis not present

## 2020-07-23 DIAGNOSIS — O99513 Diseases of the respiratory system complicating pregnancy, third trimester: Secondary | ICD-10-CM | POA: Diagnosis not present

## 2020-07-23 DIAGNOSIS — Z148 Genetic carrier of other disease: Secondary | ICD-10-CM | POA: Diagnosis not present

## 2020-07-23 DIAGNOSIS — Z3689 Encounter for other specified antenatal screening: Secondary | ICD-10-CM | POA: Diagnosis not present

## 2020-07-23 DIAGNOSIS — Z7982 Long term (current) use of aspirin: Secondary | ICD-10-CM | POA: Diagnosis not present

## 2020-07-23 DIAGNOSIS — R21 Rash and other nonspecific skin eruption: Secondary | ICD-10-CM | POA: Diagnosis not present

## 2020-07-23 DIAGNOSIS — Z3A32 32 weeks gestation of pregnancy: Secondary | ICD-10-CM | POA: Diagnosis not present

## 2020-07-23 DIAGNOSIS — O26893 Other specified pregnancy related conditions, third trimester: Secondary | ICD-10-CM | POA: Diagnosis not present

## 2020-07-23 DIAGNOSIS — O09513 Supervision of elderly primigravida, third trimester: Secondary | ICD-10-CM | POA: Diagnosis not present

## 2020-07-28 DIAGNOSIS — M5489 Other dorsalgia: Secondary | ICD-10-CM | POA: Diagnosis not present

## 2020-08-06 DIAGNOSIS — R1110 Vomiting, unspecified: Secondary | ICD-10-CM | POA: Diagnosis not present

## 2020-08-06 DIAGNOSIS — O09523 Supervision of elderly multigravida, third trimester: Secondary | ICD-10-CM | POA: Diagnosis not present

## 2020-08-06 DIAGNOSIS — O09293 Supervision of pregnancy with other poor reproductive or obstetric history, third trimester: Secondary | ICD-10-CM | POA: Diagnosis not present

## 2020-08-06 DIAGNOSIS — J45909 Unspecified asthma, uncomplicated: Secondary | ICD-10-CM | POA: Diagnosis not present

## 2020-08-06 DIAGNOSIS — Z6791 Unspecified blood type, Rh negative: Secondary | ICD-10-CM | POA: Diagnosis not present

## 2020-08-06 DIAGNOSIS — O368330 Maternal care for abnormalities of the fetal heart rate or rhythm, third trimester, not applicable or unspecified: Secondary | ICD-10-CM | POA: Diagnosis not present

## 2020-08-06 DIAGNOSIS — O99513 Diseases of the respiratory system complicating pregnancy, third trimester: Secondary | ICD-10-CM | POA: Diagnosis not present

## 2020-08-06 DIAGNOSIS — Z3A34 34 weeks gestation of pregnancy: Secondary | ICD-10-CM | POA: Diagnosis not present

## 2020-08-06 DIAGNOSIS — Z7982 Long term (current) use of aspirin: Secondary | ICD-10-CM | POA: Diagnosis not present

## 2020-08-06 DIAGNOSIS — O26893 Other specified pregnancy related conditions, third trimester: Secondary | ICD-10-CM | POA: Diagnosis not present

## 2020-08-06 DIAGNOSIS — O99891 Other specified diseases and conditions complicating pregnancy: Secondary | ICD-10-CM | POA: Diagnosis not present

## 2020-08-21 DIAGNOSIS — Z148 Genetic carrier of other disease: Secondary | ICD-10-CM | POA: Diagnosis not present

## 2020-08-21 DIAGNOSIS — Z3A36 36 weeks gestation of pregnancy: Secondary | ICD-10-CM | POA: Diagnosis not present

## 2020-08-21 DIAGNOSIS — O09519 Supervision of elderly primigravida, unspecified trimester: Secondary | ICD-10-CM | POA: Diagnosis not present

## 2020-08-21 DIAGNOSIS — O368330 Maternal care for abnormalities of the fetal heart rate or rhythm, third trimester, not applicable or unspecified: Secondary | ICD-10-CM | POA: Diagnosis not present

## 2020-08-21 DIAGNOSIS — O09523 Supervision of elderly multigravida, third trimester: Secondary | ICD-10-CM | POA: Diagnosis not present

## 2020-08-21 DIAGNOSIS — Z3689 Encounter for other specified antenatal screening: Secondary | ICD-10-CM | POA: Diagnosis not present

## 2020-08-21 DIAGNOSIS — O99013 Anemia complicating pregnancy, third trimester: Secondary | ICD-10-CM | POA: Diagnosis not present

## 2020-08-27 DIAGNOSIS — Z6791 Unspecified blood type, Rh negative: Secondary | ICD-10-CM | POA: Diagnosis not present

## 2020-08-27 DIAGNOSIS — J45909 Unspecified asthma, uncomplicated: Secondary | ICD-10-CM | POA: Diagnosis not present

## 2020-08-27 DIAGNOSIS — O09523 Supervision of elderly multigravida, third trimester: Secondary | ICD-10-CM | POA: Diagnosis not present

## 2020-08-27 DIAGNOSIS — O26899 Other specified pregnancy related conditions, unspecified trimester: Secondary | ICD-10-CM | POA: Diagnosis not present

## 2020-08-27 DIAGNOSIS — O09293 Supervision of pregnancy with other poor reproductive or obstetric history, third trimester: Secondary | ICD-10-CM | POA: Diagnosis not present

## 2020-08-27 DIAGNOSIS — Z8279 Family history of other congenital malformations, deformations and chromosomal abnormalities: Secondary | ICD-10-CM | POA: Diagnosis not present

## 2020-08-27 DIAGNOSIS — O0993 Supervision of high risk pregnancy, unspecified, third trimester: Secondary | ICD-10-CM | POA: Diagnosis not present

## 2020-08-27 DIAGNOSIS — Z148 Genetic carrier of other disease: Secondary | ICD-10-CM | POA: Diagnosis not present

## 2020-08-27 DIAGNOSIS — O99519 Diseases of the respiratory system complicating pregnancy, unspecified trimester: Secondary | ICD-10-CM | POA: Diagnosis not present

## 2020-08-27 DIAGNOSIS — Z3A37 37 weeks gestation of pregnancy: Secondary | ICD-10-CM | POA: Diagnosis not present

## 2020-08-27 DIAGNOSIS — Z3689 Encounter for other specified antenatal screening: Secondary | ICD-10-CM | POA: Diagnosis not present

## 2020-09-03 DIAGNOSIS — O99519 Diseases of the respiratory system complicating pregnancy, unspecified trimester: Secondary | ICD-10-CM | POA: Diagnosis not present

## 2020-09-03 DIAGNOSIS — O0993 Supervision of high risk pregnancy, unspecified, third trimester: Secondary | ICD-10-CM | POA: Diagnosis not present

## 2020-09-03 DIAGNOSIS — O26899 Other specified pregnancy related conditions, unspecified trimester: Secondary | ICD-10-CM | POA: Diagnosis not present

## 2020-09-03 DIAGNOSIS — Z3A38 38 weeks gestation of pregnancy: Secondary | ICD-10-CM | POA: Diagnosis not present

## 2020-09-03 DIAGNOSIS — Z148 Genetic carrier of other disease: Secondary | ICD-10-CM | POA: Diagnosis not present

## 2020-09-03 DIAGNOSIS — Z6791 Unspecified blood type, Rh negative: Secondary | ICD-10-CM | POA: Diagnosis not present

## 2020-09-03 DIAGNOSIS — J45909 Unspecified asthma, uncomplicated: Secondary | ICD-10-CM | POA: Diagnosis not present

## 2020-09-03 DIAGNOSIS — Z8279 Family history of other congenital malformations, deformations and chromosomal abnormalities: Secondary | ICD-10-CM | POA: Diagnosis not present

## 2020-09-03 DIAGNOSIS — O09523 Supervision of elderly multigravida, third trimester: Secondary | ICD-10-CM | POA: Diagnosis not present

## 2020-09-03 DIAGNOSIS — O09293 Supervision of pregnancy with other poor reproductive or obstetric history, third trimester: Secondary | ICD-10-CM | POA: Diagnosis not present

## 2020-09-03 DIAGNOSIS — Z3689 Encounter for other specified antenatal screening: Secondary | ICD-10-CM | POA: Diagnosis not present

## 2020-09-10 DIAGNOSIS — O480 Post-term pregnancy: Secondary | ICD-10-CM | POA: Diagnosis not present

## 2020-09-10 DIAGNOSIS — Z79899 Other long term (current) drug therapy: Secondary | ICD-10-CM | POA: Diagnosis not present

## 2020-09-10 DIAGNOSIS — Z2913 Encounter for prophylactic Rho(D) immune globulin: Secondary | ICD-10-CM | POA: Diagnosis not present

## 2020-09-10 DIAGNOSIS — O99344 Other mental disorders complicating childbirth: Secondary | ICD-10-CM | POA: Diagnosis not present

## 2020-09-10 DIAGNOSIS — Z3A39 39 weeks gestation of pregnancy: Secondary | ICD-10-CM | POA: Diagnosis not present

## 2020-09-10 DIAGNOSIS — O99892 Other specified diseases and conditions complicating childbirth: Secondary | ICD-10-CM | POA: Diagnosis not present

## 2020-09-10 DIAGNOSIS — O9952 Diseases of the respiratory system complicating childbirth: Secondary | ICD-10-CM | POA: Diagnosis not present

## 2020-09-10 DIAGNOSIS — Z148 Genetic carrier of other disease: Secondary | ICD-10-CM | POA: Diagnosis not present

## 2020-09-10 DIAGNOSIS — F410 Panic disorder [episodic paroxysmal anxiety] without agoraphobia: Secondary | ICD-10-CM | POA: Diagnosis not present

## 2020-09-10 DIAGNOSIS — O134 Gestational [pregnancy-induced] hypertension without significant proteinuria, complicating childbirth: Secondary | ICD-10-CM | POA: Diagnosis not present

## 2020-09-10 DIAGNOSIS — Z3689 Encounter for other specified antenatal screening: Secondary | ICD-10-CM | POA: Diagnosis not present

## 2020-09-10 DIAGNOSIS — O09523 Supervision of elderly multigravida, third trimester: Secondary | ICD-10-CM | POA: Diagnosis not present

## 2020-09-10 DIAGNOSIS — J45909 Unspecified asthma, uncomplicated: Secondary | ICD-10-CM | POA: Diagnosis not present

## 2020-09-10 DIAGNOSIS — Z6741 Type O blood, Rh negative: Secondary | ICD-10-CM | POA: Diagnosis not present

## 2020-09-10 DIAGNOSIS — Z20822 Contact with and (suspected) exposure to covid-19: Secondary | ICD-10-CM | POA: Diagnosis not present

## 2020-09-10 DIAGNOSIS — Z87891 Personal history of nicotine dependence: Secondary | ICD-10-CM | POA: Diagnosis not present

## 2020-09-10 DIAGNOSIS — O360130 Maternal care for anti-D [Rh] antibodies, third trimester, not applicable or unspecified: Secondary | ICD-10-CM | POA: Diagnosis not present

## 2020-09-10 DIAGNOSIS — Z3A40 40 weeks gestation of pregnancy: Secondary | ICD-10-CM | POA: Diagnosis not present

## 2020-09-10 DIAGNOSIS — Z7982 Long term (current) use of aspirin: Secondary | ICD-10-CM | POA: Diagnosis not present

## 2020-09-10 DIAGNOSIS — O26893 Other specified pregnancy related conditions, third trimester: Secondary | ICD-10-CM | POA: Diagnosis not present

## 2020-09-10 DIAGNOSIS — Q950 Balanced translocation and insertion in normal individual: Secondary | ICD-10-CM | POA: Diagnosis not present

## 2020-09-12 DIAGNOSIS — Z2913 Encounter for prophylactic Rho(D) immune globulin: Secondary | ICD-10-CM | POA: Diagnosis not present

## 2020-09-19 DIAGNOSIS — J45909 Unspecified asthma, uncomplicated: Secondary | ICD-10-CM | POA: Diagnosis not present

## 2020-09-19 DIAGNOSIS — Z91013 Allergy to seafood: Secondary | ICD-10-CM | POA: Diagnosis not present

## 2020-09-19 DIAGNOSIS — O1415 Severe pre-eclampsia, complicating the puerperium: Secondary | ICD-10-CM | POA: Insufficient documentation

## 2020-09-19 DIAGNOSIS — Z886 Allergy status to analgesic agent status: Secondary | ICD-10-CM | POA: Diagnosis not present

## 2020-09-19 DIAGNOSIS — Z87891 Personal history of nicotine dependence: Secondary | ICD-10-CM | POA: Diagnosis not present

## 2020-09-19 DIAGNOSIS — O9953 Diseases of the respiratory system complicating the puerperium: Secondary | ICD-10-CM | POA: Diagnosis not present

## 2020-09-19 DIAGNOSIS — Z20822 Contact with and (suspected) exposure to covid-19: Secondary | ICD-10-CM | POA: Diagnosis not present

## 2020-10-06 DIAGNOSIS — O1415 Severe pre-eclampsia, complicating the puerperium: Secondary | ICD-10-CM | POA: Diagnosis not present

## 2020-10-06 DIAGNOSIS — Z3A40 40 weeks gestation of pregnancy: Secondary | ICD-10-CM | POA: Diagnosis not present

## 2020-10-15 DIAGNOSIS — O1415 Severe pre-eclampsia, complicating the puerperium: Secondary | ICD-10-CM | POA: Diagnosis not present

## 2020-10-15 DIAGNOSIS — Q529 Congenital malformation of female genitalia, unspecified: Secondary | ICD-10-CM | POA: Diagnosis not present

## 2020-10-15 DIAGNOSIS — N898 Other specified noninflammatory disorders of vagina: Secondary | ICD-10-CM | POA: Diagnosis not present

## 2020-11-11 ENCOUNTER — Other Ambulatory Visit: Payer: Self-pay

## 2020-11-11 ENCOUNTER — Encounter: Payer: Self-pay | Admitting: Family Medicine

## 2020-11-11 ENCOUNTER — Ambulatory Visit: Payer: BC Managed Care – PPO | Admitting: Family Medicine

## 2020-11-11 VITALS — BP 90/60 | HR 60 | Temp 98.0°F | Ht 63.75 in | Wt 136.5 lb

## 2020-11-11 DIAGNOSIS — N941 Unspecified dyspareunia: Secondary | ICD-10-CM

## 2020-11-11 DIAGNOSIS — O1415 Severe pre-eclampsia, complicating the puerperium: Secondary | ICD-10-CM | POA: Diagnosis not present

## 2020-11-11 MED ORDER — METOPROLOL TARTRATE 25 MG PO TABS: 12.5000 mg | ORAL_TABLET | Freq: Two times a day (BID) | ORAL | 0 refills | Status: DC | PRN

## 2020-11-11 NOTE — Progress Notes (Signed)
Patient ID: Tonya Carrillo, female    DOB: 02/23/1981, 40 y.o.   MRN: [id number]  This visit was conducted in person.  BP 90/60   Pulse 60   Temp 98 F (36.7 C) (Temporal)   Ht 5' 3.75" (1.619 m)   Wt 136 lb 8 oz (61.9 kg)   SpO2 98%   Breastfeeding No   BMI 23.61 kg/m    CC: Chief Complaint  Patient presents with   Postpatrum Pre-Eclapsia    Sent to PCP by OB-GYN to taper medications    Subjective:   HPI: Tonya Carrillo is a 40 y.o. female presenting on 11/11/2020 for Postpatrum Pre-Eclapsia (Sent to PCP by OB-GYN to taper medications)  During recent pregnancy she had preeclampsia.  Started on nifedipine 30 mg XL daily. Since delivery 09/12/2020   At BP check 1 week after delivery 140/90-170/90.  Given Mg sulfate... started on procardia in ER   No swelling. No blurry vision.  BP Readings from Last 3 Encounters:  11/11/20 90/60  04/25/18 122/86  04/17/18 127/81   BP yesterday 116/82.   She is not breast feeding.  No previous BP issue.   She has some pain during intercourse... she would like to see pelvic floor therapist.     Relevant past medical, surgical, family and social history reviewed and updated as indicated. Interim medical history since our last visit reviewed. Allergies and medications reviewed and updated. Outpatient Medications Prior to Visit  Medication Sig Dispense Refill   ALPRAZolam (XANAX) 0.25 MG tablet Take by mouth.     Biotin 1000 MCG tablet Take by mouth.     Cholecalciferol 25 MCG (1000 UT) tablet Take 1 tablet by mouth daily.     Coenzyme Q10 200 MG capsule Take by mouth.     cyanocobalamin 1000 MCG tablet Take by mouth.     docusate sodium (COLACE) 100 MG capsule Take 100 mg by mouth 2 (two) times daily as needed for mild constipation.     ELDERBERRY PO Take 2 each by mouth daily.     NIFEdipine (PROCARDIA-XL/NIFEDICAL-XL) 30 MG 24 hr tablet Take by mouth.     polyethylene glycol powder (GLYCOLAX/MIRALAX) 17 GM/SCOOP  powder Take by mouth.     Prenatal Multivit-Min-Fe-FA (PRENATAL, W/IRON & FA,) 27-0.8 MG TABS Take 1 tablet by mouth daily.     albuterol (PROAIR HFA) 108 (90 Base) MCG/ACT inhaler Inhale 2 puffs into the lungs every 6 (six) hours as needed. 1 Inhaler 0   ALPRAZolam (XANAX) 0.25 MG tablet Take 1 tablet by mouth 20 minutes prior to flight as needed for anxiety. 15 tablet 0   No facility-administered medications prior to visit.     Per HPI unless specifically indicated in ROS section below Review of Systems  Constitutional:  Negative for fatigue and fever.  HENT:  Negative for ear pain.   Eyes:  Negative for pain.  Respiratory:  Negative for chest tightness and shortness of breath.   Cardiovascular:  Negative for chest pain, palpitations and leg swelling.  Gastrointestinal:  Negative for abdominal pain.  Genitourinary:  Negative for dysuria.  Objective:  BP 90/60   Pulse 60   Temp 98 F (36.7 C) (Temporal)   Ht 5' 3.75" (1.619 m)   Wt 136 lb 8 oz (61.9 kg)   SpO2 98%   Breastfeeding No   BMI 23.61 kg/m   Wt Readings from Last 3 Encounters:  11/11/20 136 lb 8 oz (61.9 kg)  08/22/18 125  lb (56.7 kg)  04/25/18 132 lb 3.2 oz (60 kg)      Physical Exam Constitutional:      General: She is not in acute distress.    Appearance: Normal appearance. She is well-developed. She is not ill-appearing or toxic-appearing.  HENT:     Head: Normocephalic.     Right Ear: Hearing, tympanic membrane, ear canal and external ear normal. Tympanic membrane is not erythematous, retracted or bulging.     Left Ear: Hearing, tympanic membrane, ear canal and external ear normal. Tympanic membrane is not erythematous, retracted or bulging.     Nose: No mucosal edema or rhinorrhea.     Right Sinus: No maxillary sinus tenderness or frontal sinus tenderness.     Left Sinus: No maxillary sinus tenderness or frontal sinus tenderness.     Mouth/Throat:     Pharynx: Uvula midline.  Eyes:     General: Lids  are normal. Lids are everted, no foreign bodies appreciated.     Conjunctiva/sclera: Conjunctivae normal.     Pupils: Pupils are equal, round, and reactive to light.  Neck:     Thyroid: No thyroid mass or thyromegaly.     Vascular: No carotid bruit.     Trachea: Trachea normal.  Cardiovascular:     Rate and Rhythm: Normal rate and regular rhythm.     Pulses: Normal pulses.     Heart sounds: Normal heart sounds, S1 normal and S2 normal. No murmur heard.   No friction rub. No gallop.  Pulmonary:     Effort: Pulmonary effort is normal. No tachypnea or respiratory distress.     Breath sounds: Normal breath sounds. No decreased breath sounds, wheezing, rhonchi or rales.  Abdominal:     General: Bowel sounds are normal.     Palpations: Abdomen is soft.     Tenderness: There is no abdominal tenderness.  Musculoskeletal:     Cervical back: Normal range of motion and neck supple.  Skin:    General: Skin is warm and dry.     Findings: No rash.  Neurological:     Mental Status: She is alert.  Psychiatric:        Mood and Affect: Mood is not anxious or depressed.        Speech: Speech normal.        Behavior: Behavior normal. Behavior is cooperative.        Thought Content: Thought content normal.        Judgment: Judgment normal.      Results for orders placed or performed in visit on 10/14/15  Cytology - PAP ([place])  Result Value Ref Range   CYTOLOGY - PAP PAP RESULT     This visit occurred during the SARS-CoV-2 public health emergency.  Safety protocols were in place, including screening questions prior to the visit, additional usage of staff PPE, and extensive cleaning of exam room while observing appropriate contact time as indicated for disinfecting solutions.   COVID 19 screen:  No recent travel or known exposure to COVID19 The patient denies respiratory symptoms of COVID 19 at this time. The importance of social distancing was discussed today.   Assessment and Plan      Eliezer Lofts, MD

## 2020-11-11 NOTE — Assessment & Plan Note (Signed)
No prior Dx of HTN.  Stop procardia.  Given rx for metoprolol to use prn rebound HTN.  Goal reviewed < 140/90.  Encouraged exercise, weight loss, healthy eating habits.

## 2020-11-11 NOTE — Patient Instructions (Addendum)
Stop procardia Xl.  Keep up with  healthy eating and regular exercise.  Follow BP daily for  2 week .. if BP if consistently > 140/90.. You can take a metoprolol every 12 hours as needed. I have placed pelvic flor therapy referral for you.

## 2020-11-11 NOTE — Assessment & Plan Note (Signed)
Referred to pelvic floor therapist

## 2020-12-17 ENCOUNTER — Ambulatory Visit: Payer: Self-pay

## 2020-12-17 ENCOUNTER — Other Ambulatory Visit: Payer: Self-pay

## 2020-12-17 DIAGNOSIS — Z23 Encounter for immunization: Secondary | ICD-10-CM

## 2021-02-16 DIAGNOSIS — Z148 Genetic carrier of other disease: Secondary | ICD-10-CM | POA: Diagnosis not present

## 2021-02-16 DIAGNOSIS — Z3169 Encounter for other general counseling and advice on procreation: Secondary | ICD-10-CM | POA: Diagnosis not present

## 2021-07-28 ENCOUNTER — Encounter: Payer: Self-pay | Admitting: Medical

## 2021-07-28 ENCOUNTER — Ambulatory Visit: Payer: Self-pay | Admitting: Medical

## 2021-07-28 VITALS — BP 110/70 | HR 54 | Temp 98.2°F | Resp 16

## 2021-07-28 DIAGNOSIS — B3731 Acute candidiasis of vulva and vagina: Secondary | ICD-10-CM

## 2021-07-28 DIAGNOSIS — J0110 Acute frontal sinusitis, unspecified: Secondary | ICD-10-CM

## 2021-07-28 MED ORDER — FLUCONAZOLE 150 MG PO TABS: 150.0000 mg | ORAL_TABLET | Freq: Every day | ORAL | 0 refills | Status: DC

## 2021-07-28 MED ORDER — AMOXICILLIN-POT CLAVULANATE 875-125 MG PO TABS: 1.0000 | ORAL_TABLET | Freq: Two times a day (BID) | ORAL | 0 refills | Status: DC

## 2021-07-28 NOTE — Progress Notes (Signed)
? ?  Subjective:  ? ? Patient ID: Tonya Carrillo, female    DOB: [birth year], 41 y.o.   MRN: [id number] ? ?HPI ?41 yo female in non acute distress. Started over the weekend, last  2 days with yellowish coloring coming from nose.denies fever or chills. ? ? ?Review of Systems  ?Constitutional:  Negative for chills and fever.  ?HENT:  Positive for congestion, postnasal drip, rhinorrhea, sinus pressure and sinus pain. Negative for ear pain (bilateral ear pressure) and sore throat.   ?Respiratory:  Positive for cough.   ? ?   ?Objective:  ? Physical Exam ?Vitals and nursing note reviewed.  ?Constitutional:   ?   Appearance: Normal appearance.  ?HENT:  ?   Head: Normocephalic and atraumatic.  ?   Right Ear: Tympanic membrane, ear canal and external ear normal.  ?   Left Ear: Tympanic membrane, ear canal and external ear normal.  ?   Nose: Congestion present.  ?   Mouth/Throat:  ?   Mouth: Mucous membranes are moist.  ?   Pharynx: Oropharynx is clear.  ?Eyes:  ?   Extraocular Movements: Extraocular movements intact.  ?   Conjunctiva/sclera: Conjunctivae normal.  ?   Pupils: Pupils are equal, round, and reactive to light.  ?Cardiovascular:  ?   Rate and Rhythm: Normal rate and regular rhythm.  ?   Heart sounds: Normal heart sounds.  ?Pulmonary:  ?   Effort: Pulmonary effort is normal.  ?   Breath sounds: Normal breath sounds.  ?Neurological:  ?   Mental Status: She is alert and oriented to person, place, and time. Mental status is at baseline.  ?Psychiatric:     ?   Mood and Affect: Mood normal.     ?   Behavior: Behavior normal.     ?   Thought Content: Thought content normal.     ?   Judgment: Judgment normal.  ? ? ? ? ? ?   ?Assessment & Plan:  ? ?Encounter Diagnoses  ?Name Primary?  ? Acute non-recurrent frontal sinusitis Yes  ? Yeast vaginitis   ?  ?Meds ordered this encounter  ?Medications  ? amoxicillin-clavulanate (AUGMENTIN) 875-125 MG tablet  ?  Sig: Take 1 tablet by mouth 2 (two) times daily.  ?  Dispense:  20  tablet  ?  Refill:  0  ? fluconazole (DIFLUCAN) 150 MG tablet  ?  Sig: Take 1 tablet (150 mg total) by mouth daily. Take last day of antibiotics  ?  Dispense:  1 tablet  ?  Refill:  0  ? Return in 3-5 days if symptoms are not improving. ?Patient verbalizes understanding and has no questions at discharge. ? ?

## 2021-08-03 ENCOUNTER — Encounter: Payer: Self-pay | Admitting: Medical

## 2021-08-13 ENCOUNTER — Encounter: Payer: Self-pay | Admitting: Medical

## 2021-10-02 ENCOUNTER — Encounter: Payer: Self-pay | Admitting: Adult Health

## 2021-10-02 ENCOUNTER — Ambulatory Visit: Payer: Self-pay | Admitting: Adult Health

## 2021-10-02 VITALS — BP 102/60 | HR 50 | Temp 97.7°F | Wt 128.8 lb

## 2021-10-02 DIAGNOSIS — L03012 Cellulitis of left finger: Secondary | ICD-10-CM

## 2021-10-02 MED ORDER — CEPHALEXIN 500 MG PO CAPS: 500.0000 mg | ORAL_CAPSULE | Freq: Two times a day (BID) | ORAL | 0 refills | Status: DC

## 2021-10-02 NOTE — Progress Notes (Signed)
[facility] [address], [place] [address]   Office Visit Note  Patient Name: Tonya Carrillo Date of Birth [id number]  Medical Record number [id number]  Date of Service: 10/02/2021  Chief Complaint  Patient presents with   Finger Injury    Left hand, index finger. Pt stated she hit her finger Mon or Tue of this week. It is red, painful ans swollen. No discharge or fever.      HPI Patient reports a few days ago she thinks she hit her left index finger on something.  Yesterday she noticed it was becoming red beside the nail, and is now red down to the first knuckle. It is tender to touch, and swollen.  Denies any discharge or drainage.    Current Medication:  Outpatient Encounter Medications as of 10/02/2021  Medication Sig   ALPRAZolam (XANAX) 0.25 MG tablet Take by mouth.   Biotin 1000 MCG tablet Take by mouth.   cephALEXin (KEFLEX) 500 MG capsule Take 1 capsule (500 mg total) by mouth 2 (two) times daily.   Coenzyme Q10 200 MG capsule Take by mouth.   cyanocobalamin 1000 MCG tablet Take by mouth.   ELDERBERRY PO Take 2 each by mouth daily.   Prenatal Multivit-Min-Fe-FA (PRENATAL, W/IRON & FA,) 27-0.8 MG TABS Take 1 tablet by mouth daily.   amoxicillin-clavulanate (AUGMENTIN) 875-125 MG tablet Take 1 tablet by mouth 2 (two) times daily. (Patient not taking: Reported on 10/02/2021)   Cholecalciferol 25 MCG (1000 UT) tablet Take 1 tablet by mouth daily. (Patient not taking: Reported on 10/02/2021)   docusate sodium (COLACE) 100 MG capsule Take 100 mg by mouth 2 (two) times daily as needed for mild constipation. (Patient not taking: Reported on 10/02/2021)   fluconazole (DIFLUCAN) 150 MG tablet Take 1 tablet (150 mg total) by mouth daily. Take last day of antibiotics (Patient not taking: Reported on 10/02/2021)   metoprolol tartrate (LOPRESSOR) 25 MG tablet Take 0.5-1 tablets (12.5-25 mg total) by mouth 2 (two) times daily as needed. (Patient not taking: Reported on  07/28/2021)   NIFEdipine (PROCARDIA-XL/NIFEDICAL-XL) 30 MG 24 hr tablet Take by mouth. (Patient not taking: Reported on 10/02/2021)   polyethylene glycol powder (GLYCOLAX/MIRALAX) 17 GM/SCOOP powder Take by mouth. (Patient not taking: Reported on 07/28/2021)   No facility-administered encounter medications on file as of 10/02/2021.      Medical History: Past Medical History:  Diagnosis Date   Asthma    GERD (gastroesophageal reflux disease)    History of chicken pox    UTI (lower urinary tract infection)      Vital Signs: BP 102/60   Pulse (!) 50   Temp 97.7 F (36.5 C) (Tympanic)   Wt 128 lb 12.8 oz (58.4 kg)   SpO2 97%   BMI 22.28 kg/m    Review of Systems  Constitutional:  Negative for fatigue and fever.  Musculoskeletal:  Positive for joint swelling.    Physical Exam Vitals and nursing note reviewed.  Constitutional:      Appearance: Normal appearance.  Musculoskeletal:     Right hand: Normal.     Left hand: Swelling and tenderness present.       Arms:     Comments: +erythema to left index finger, from DPJ  Neurological:     Mental Status: She is alert.    Assessment/Plan: 1. Paronychia of finger, left Take complete course of antibiotics as prescribed.  Take with food.  If symptoms fail to improve, or new/worse symptoms  develop follow up in clinic.  Use warm water/epsom salt soaks 2-3 times daily.  May take Ibuprofen or advil '600mg'$  every 6-8 hours as needed.   - cephALEXin (KEFLEX) 500 MG capsule; Take 1 capsule (500 mg total) by mouth 2 (two) times daily.  Dispense: 20 capsule; Refill: 0     General Counseling: Kinisha verbalizes understanding of the findings of todays visit and agrees with plan of treatment. I have discussed any further diagnostic evaluation that may be needed or ordered today. We also reviewed her medications today. she has been encouraged to call the office with any questions or concerns that should arise related to todays visit.   No  orders of the defined types were placed in this encounter.   Meds ordered this encounter  Medications   cephALEXin (KEFLEX) 500 MG capsule    Sig: Take 1 capsule (500 mg total) by mouth 2 (two) times daily.    Dispense:  20 capsule    Refill:  0    Time spent:25 Minutes    Kendell Bane AGNP-C Nurse Practitioner

## 2021-12-22 ENCOUNTER — Ambulatory Visit: Payer: BC Managed Care – PPO | Admitting: Family Medicine

## 2021-12-26 DIAGNOSIS — H109 Unspecified conjunctivitis: Secondary | ICD-10-CM | POA: Diagnosis not present

## 2021-12-26 DIAGNOSIS — J069 Acute upper respiratory infection, unspecified: Secondary | ICD-10-CM | POA: Diagnosis not present

## 2021-12-31 ENCOUNTER — Ambulatory Visit: Payer: BC Managed Care – PPO | Admitting: Family Medicine

## 2021-12-31 VITALS — BP 90/70 | HR 53 | Temp 98.3°F | Ht 64.0 in | Wt 125.1 lb

## 2021-12-31 DIAGNOSIS — L659 Nonscarring hair loss, unspecified: Secondary | ICD-10-CM | POA: Insufficient documentation

## 2021-12-31 DIAGNOSIS — N938 Other specified abnormal uterine and vaginal bleeding: Secondary | ICD-10-CM

## 2021-12-31 DIAGNOSIS — R6889 Other general symptoms and signs: Secondary | ICD-10-CM | POA: Diagnosis not present

## 2021-12-31 DIAGNOSIS — R002 Palpitations: Secondary | ICD-10-CM | POA: Insufficient documentation

## 2021-12-31 NOTE — Patient Instructions (Signed)
Please stop at the lab to have labs drawn.  

## 2021-12-31 NOTE — Progress Notes (Signed)
Patient ID: Tonya Carrillo, female    DOB: [birth year], 41 y.o.   MRN: [id number]  This visit was conducted in person.  BP 90/70   Pulse (!) 53   Temp 98.3 F (36.8 C) (Oral)   Ht '5\' 4"'$  (1.626 m)   Wt 125 lb 2 oz (56.8 kg)   LMP 12/09/2021   SpO2 98%   BMI 21.48 kg/m    CC: alopecia , weight loss and palpations.   Subjective:   HPI: Tonya Carrillo is a 41 y.o. female presenting on 12/31/2021 for Alopecia, Palpitations, and Weight Loss   She reports new onset  hair loss, cold intolerance and weight loss in last several months.   Symptoms occurred after illness... food aversion. Some nausea , no diarrhea. No fever. Lasted 4-5 days.  Lost 3-4 lbs.   She has also noted 3 weeks ago 1 week of repeated palpitations, then resolved.  No CP, no SOB.  No jitteriness.  She is feeling tired but no different than usual with poor sleep.   Family history in mother of hypothyroid.     No bleeding, no heavy menses. Cycle is irregular... may be having irregualr ovulation per ovulation kits.  She is a vegetarian... she is using prenatal vitamin.  Relevant past medical, surgical, family and social history reviewed and updated as indicated. Interim medical history since our last visit reviewed. Allergies and medications reviewed and updated. Outpatient Medications Prior to Visit  Medication Sig Dispense Refill   ALPRAZolam (XANAX) 0.25 MG tablet Take 0.25 mg by mouth daily as needed. Only when flying     CHOLINE PO 250 mg. 2 tablets by mouth every morning and 1 tablet by mouth every evening     Coenzyme Q10 200 MG capsule Take by mouth.     ELDERBERRY PO Take 2 each by mouth daily.     Omega-3 Fatty Acids (OMEGA 3 PO) Take 1 capsule by mouth in the morning and at bedtime.     Prenatal Multivit-Min-Fe-FA (PRENATAL, W/IRON & FA,) 27-0.8 MG TABS Take 1 tablet by mouth daily.     trimethoprim-polymyxin b (POLYTRIM) ophthalmic solution 1 drop every 6 (six) hours.      amoxicillin-clavulanate (AUGMENTIN) 875-125 MG tablet Take 1 tablet by mouth 2 (two) times daily. (Patient not taking: Reported on 10/02/2021) 20 tablet 0   Biotin 1000 MCG tablet Take by mouth.     cephALEXin (KEFLEX) 500 MG capsule Take 1 capsule (500 mg total) by mouth 2 (two) times daily. 20 capsule 0   Cholecalciferol 25 MCG (1000 UT) tablet Take 1 tablet by mouth daily. (Patient not taking: Reported on 10/02/2021)     cyanocobalamin 1000 MCG tablet Take by mouth.     docusate sodium (COLACE) 100 MG capsule Take 100 mg by mouth 2 (two) times daily as needed for mild constipation. (Patient not taking: Reported on 10/02/2021)     fluconazole (DIFLUCAN) 150 MG tablet Take 1 tablet (150 mg total) by mouth daily. Take last day of antibiotics (Patient not taking: Reported on 10/02/2021) 1 tablet 0   metoprolol tartrate (LOPRESSOR) 25 MG tablet Take 0.5-1 tablets (12.5-25 mg total) by mouth 2 (two) times daily as needed. (Patient not taking: Reported on 07/28/2021) 20 tablet 0   polyethylene glycol powder (GLYCOLAX/MIRALAX) 17 GM/SCOOP powder Take by mouth. (Patient not taking: Reported on 07/28/2021)     No facility-administered medications prior to visit.     Per HPI unless specifically indicated in ROS section  below Review of Systems  Constitutional:  Positive for fatigue. Negative for fever.  HENT:  Negative for congestion.   Eyes:  Negative for pain.  Respiratory:  Negative for cough and shortness of breath.   Cardiovascular:  Positive for palpitations. Negative for chest pain and leg swelling.  Gastrointestinal:  Negative for abdominal pain.  Genitourinary:  Negative for dysuria and vaginal bleeding.  Musculoskeletal:  Negative for back pain.  Neurological:  Negative for syncope, light-headedness and headaches.  Psychiatric/Behavioral:  Negative for dysphoric mood.    Objective:  BP 90/70   Pulse (!) 53   Temp 98.3 F (36.8 C) (Oral)   Ht '5\' 4"'$  (1.626 m)   Wt 125 lb 2 oz (56.8 kg)   LMP  12/09/2021   SpO2 98%   BMI 21.48 kg/m   Wt Readings from Last 3 Encounters:  12/31/21 125 lb 2 oz (56.8 kg)  10/02/21 128 lb 12.8 oz (58.4 kg)  11/11/20 136 lb 8 oz (61.9 kg)      Physical Exam Vitals and nursing note reviewed.  Constitutional:      General: She is not in acute distress.    Appearance: Normal appearance. She is well-developed. She is not ill-appearing or toxic-appearing.  HENT:     Head: Normocephalic.     Right Ear: Hearing, tympanic membrane, ear canal and external ear normal.     Left Ear: Hearing, tympanic membrane, ear canal and external ear normal.     Nose: Nose normal.  Eyes:     General: Lids are normal. Lids are everted, no foreign bodies appreciated.     Conjunctiva/sclera: Conjunctivae normal.     Pupils: Pupils are equal, round, and reactive to light.  Neck:     Thyroid: No thyroid mass or thyromegaly.     Vascular: No carotid bruit.     Trachea: Trachea normal.  Cardiovascular:     Rate and Rhythm: Normal rate and regular rhythm.     Heart sounds: Normal heart sounds, S1 normal and S2 normal. No murmur heard.    No gallop.  Pulmonary:     Effort: Pulmonary effort is normal. No respiratory distress.     Breath sounds: Normal breath sounds. No wheezing, rhonchi or rales.  Abdominal:     General: Bowel sounds are normal. There is no distension or abdominal bruit.     Palpations: Abdomen is soft. There is no fluid wave or mass.     Tenderness: There is no abdominal tenderness. There is no guarding or rebound.     Hernia: No hernia is present.  Musculoskeletal:     Cervical back: Normal range of motion and neck supple.  Lymphadenopathy:     Cervical: No cervical adenopathy.  Skin:    General: Skin is warm and dry.     Findings: No rash.  Neurological:     Mental Status: She is alert.     Cranial Nerves: No cranial nerve deficit.     Sensory: No sensory deficit.  Psychiatric:        Mood and Affect: Mood is not anxious or depressed.         Speech: Speech normal.        Behavior: Behavior normal. Behavior is cooperative.        Judgment: Judgment normal.       Results for orders placed or performed in visit on 10/14/15  Cytology - PAP ([place])  Result Value Ref Range   CYTOLOGY - PAP PAP RESULT  COVID 19 screen:  No recent travel or known exposure to COVID19 The patient denies respiratory symptoms of COVID 19 at this time. The importance of social distancing was discussed today.   Assessment and Plan    Problem List Items Addressed This Visit     Alopecia - Primary   Relevant Orders   TSH   T4, free   T3, free   Estradiol   Cold intolerance   Relevant Orders   TSH   T4, free   T3, free   Palpitations   Relevant Orders   CBC with Differential/Platelet   Comprehensive metabolic panel   Other Visit Diagnoses     DUB (dysfunctional uterine bleeding)       Relevant Orders   Estradiol      Unclear etiology of constellation of symptoms.  Could potentially be thyroid dysfunction versus anemia versus glucose intolerance versus early perimenopause.  Will evaluate with labs. Encouraged healthy diet and regular exercise.  She is a vegetarian and we discussed vitamin D intake to make sure she is getting adequate B vitamins.  It does appear she.  We also discussed how she is working on trying to get pregnant again.  If she continues to have missed ovulation and irregular menstrual cycle she will set up an appointment with a new gynecologist.   Eliezer Lofts, MD

## 2022-01-01 LAB — COMPREHENSIVE METABOLIC PANEL
ALT: 21 U/L (ref 0–35)
AST: 31 U/L (ref 0–37)
Albumin: 4.4 g/dL (ref 3.5–5.2)
Alkaline Phosphatase: 62 U/L (ref 39–117)
BUN: 16 mg/dL (ref 6–23)
CO2: 30 mEq/L (ref 19–32)
Calcium: 9.3 mg/dL (ref 8.4–10.5)
Chloride: 98 mEq/L (ref 96–112)
Creatinine, Ser: 0.81 mg/dL (ref 0.40–1.20)
GFR: 89.42 mL/min (ref >60.00)
Glucose, Bld: 84 mg/dL (ref 70–99)
Potassium: 4 mEq/L (ref 3.5–5.1)
Sodium: 136 mEq/L (ref 135–145)
Total Bilirubin: 0.4 mg/dL (ref 0.2–1.2)
Total Protein: 7.1 g/dL (ref 6.0–8.3)

## 2022-01-01 LAB — CBC WITH DIFFERENTIAL/PLATELET
Basophils Absolute: 0.1 10*3/uL (ref 0.0–0.1)
Basophils Relative: 0.8 % (ref 0.0–3.0)
Eosinophils Absolute: 0.1 10*3/uL (ref 0.0–0.7)
Eosinophils Relative: 1.9 % (ref 0.0–5.0)
HCT: 39 % (ref 36.0–46.0)
Hemoglobin: 13.4 g/dL (ref 12.0–15.0)
Lymphocytes Relative: 34.9 % (ref 12.0–46.0)
Lymphs Abs: 2.7 10*3/uL (ref 0.7–4.0)
MCHC: 34.3 g/dL (ref 30.0–36.0)
MCV: 95 fl (ref 78.0–100.0)
Monocytes Absolute: 0.6 10*3/uL (ref 0.1–1.0)
Monocytes Relative: 7.8 % (ref 3.0–12.0)
Neutro Abs: 4.2 10*3/uL (ref 1.4–7.7)
Neutrophils Relative %: 54.6 % (ref 43.0–77.0)
Platelets: 314 10*3/uL (ref 150.0–400.0)
RBC: 4.1 Mil/uL (ref 3.87–5.11)
RDW: 13.4 % (ref 11.5–15.5)
WBC: 7.7 10*3/uL (ref 4.0–10.5)

## 2022-01-01 LAB — T4, FREE: Free T4: 0.73 ng/dL (ref 0.60–1.60)

## 2022-01-01 LAB — TSH: TSH: 3.14 u[IU]/mL (ref 0.35–5.50)

## 2022-01-01 LAB — ESTRADIOL: Estradiol: 78 pg/mL

## 2022-01-01 LAB — T3, FREE: T3, Free: 3.2 pg/mL (ref 2.3–4.2)

## 2022-03-04 ENCOUNTER — Encounter: Payer: Self-pay | Admitting: Nurse Practitioner

## 2022-03-04 ENCOUNTER — Ambulatory Visit (INDEPENDENT_AMBULATORY_CARE_PROVIDER_SITE_OTHER): Payer: Self-pay | Admitting: Nurse Practitioner

## 2022-03-04 DIAGNOSIS — H1033 Unspecified acute conjunctivitis, bilateral: Secondary | ICD-10-CM

## 2022-03-04 MED ORDER — OFLOXACIN 0.3 % OP SOLN: 1.0000 [drp] | Freq: Four times a day (QID) | OPHTHALMIC | 0 refills | Status: AC

## 2022-03-04 NOTE — Progress Notes (Signed)
Virtual Visit Consent   Tonya Carrillo, you are scheduled for a virtual visit with a [facility] provider today. Just as with appointments in the office, your consent must be obtained to participate.   I need to obtain your verbal consent now. Are you willing to proceed with your visit today? Tonya Carrillo has provided verbal consent on 03/04/2022 for a virtual visit (video or telephone). Tonya Carrillo  Date: 03/04/2022 10:05 AM  Virtual Visit via Video Note   I, Tonya Carrillo, connected with  Tonya Carrillo  ([id number], 1980/08/21) on 03/04/22 at 10:30 AM EST by telephone and verified that I am speaking with the correct person using two identifiers.  Location: Patient: Virtual Visit Location Patient: Home Provider: Virtual Visit Location Provider: Office/Clinic   I discussed the limitations of evaluation and management by telemedicine and the availability of in person appointments. The patient expressed understanding and agreed to proceed.    History of Present Illness: Tonya Carrillo is a 40 y.o. and is being seen today for pink eye symptoms.   Wears daily contacts. Has been wearing glasses since symptom onset.    She has a son that has pink eye that was diagnosed this morning as ell   Pt with R>L symptoms   Drainage overnight & redness to eyes    Problems:  Patient Active Problem List   Diagnosis Date Noted   Palpitations 12/31/2021   Cold intolerance 12/31/2021   Alopecia 12/31/2021   Pre-eclampsia, severe, delivered with postpartum condition 09/19/2020   Rh negative, antepartum 02/07/2020   Carrier of genetic disorder 02/07/2020   Family history of trisomy 13 10/03/2019   Exercise-induced asthma 08/22/2018   Situational anxiety 02/04/2015    Allergies:  Allergies  Allergen Reactions   Omega 3-6-9 Fatty Acids [Omega-3 Fusion]     swelliing with throat and dry cough   Other     Almond had swelling in throat and mouth itching, no  respiratory   Tylenol [Acetaminophen] Cough   Shellfish Allergy Rash   Medications:  Current Outpatient Medications:    ALPRAZolam (XANAX) 0.25 MG tablet, Take 0.25 mg by mouth daily as needed. Only when flying, Disp: , Rfl:    CHOLINE PO, 250 mg. 2 tablets by mouth every morning and 1 tablet by mouth every evening, Disp: , Rfl:    Coenzyme Q10 200 MG capsule, Take by mouth., Disp: , Rfl:    ELDERBERRY PO, Take 2 each by mouth daily., Disp: , Rfl:    Omega-3 Fatty Acids (OMEGA 3 PO), Take 1 capsule by mouth in the morning and at bedtime., Disp: , Rfl:    Prenatal Multivit-Min-Fe-FA (PRENATAL, W/IRON & FA,) 27-0.8 MG TABS, Take 1 tablet by mouth daily., Disp: , Rfl:    trimethoprim-polymyxin b (POLYTRIM) ophthalmic solution, 1 drop every 6 (six) hours., Disp: , Rfl:   Observations/Objective: No labored breathing.  Speech is clear and coherent with logical content.  Patient is alert and oriented at baseline.    Assessment and Plan: 1. Acute bacterial conjunctivitis of both eyes  - ofloxacin (OCUFLOX) 0.3 % ophthalmic solution; Place 1 drop into both eyes 4 (four) times daily for 5 days.  Dispense: 5 mL; Refill: 0     Follow Up Instructions: I discussed the assessment and treatment plan with the patient. The patient was provided an opportunity to ask questions and all were answered. The patient agreed with the plan and demonstrated an understanding of the instructions.    The  patient was advised to call back or seek an in-person evaluation if the symptoms worsen or if the condition fails to improve as anticipated.  Time:  I spent 10 minutes with the patient via telehealth technology discussing the above problems/concerns.    Tonya Carrillo

## 2022-03-23 ENCOUNTER — Encounter: Payer: Self-pay | Admitting: Physician Assistant

## 2022-03-23 ENCOUNTER — Ambulatory Visit (INDEPENDENT_AMBULATORY_CARE_PROVIDER_SITE_OTHER): Payer: Self-pay | Admitting: Physician Assistant

## 2022-03-23 VITALS — BP 120/76 | HR 62 | Temp 97.6°F | Ht 64.0 in | Wt 130.6 lb

## 2022-03-23 DIAGNOSIS — B349 Viral infection, unspecified: Secondary | ICD-10-CM

## 2022-03-23 NOTE — Progress Notes (Signed)
[facility] [address], [place] [address]   Office Visit Note  Patient Name: Tonya Carrillo Date of Birth [id number]  Medical Record number [id number]  Date of Service: 03/23/2022  Chief Complaint  Patient presents with   Cough    Had pink eye 2 weeks ago. Started feeling bad on Dec. 26. Left eye is really red this morning. Congested, coughing from post nasal drip. Mucus is "golden yellow". No fever, BA, ST     42 y/o F presents to the clinic for c/o nasal congestion, post nasal drainage, mild cough x 1 week, but noticed left eye redness earlier today. She also had matting of the left eye for the past 2-3 days. No known exposure to Covid, flu, or RSV. Denies known exposure to conjunctivitis. Tested negative for covid x 2. She used Flonase for 2 days without any relief so started with Afrin with much improvement. She denies CP, SOB, wheezing, body aches. +family members with similar cold symptoms. Has side effects from Sudafed ie increased heart rate, so she avoids taking pseudoephedrine.   Cough Associated symptoms include eye redness and postnasal drip. Pertinent negatives include no ear pain, rhinorrhea, sore throat, shortness of breath or wheezing.      Current Medication:  Outpatient Encounter Medications as of 03/23/2022  Medication Sig   CHOLINE PO 250 mg. 2 tablets by mouth every morning and 1 tablet by mouth every evening   Coenzyme Q10 200 MG capsule Take by mouth.   ELDERBERRY PO Take 2 each by mouth daily.   Omega-3 Fatty Acids (OMEGA 3 PO) Take 1 capsule by mouth in the morning and at bedtime.   Prenatal Multivit-Min-Fe-FA (PRENATAL, W/IRON & FA,) 27-0.8 MG TABS Take 1 tablet by mouth daily.   ALPRAZolam (XANAX) 0.25 MG tablet Take 0.25 mg by mouth daily as needed. Only when flying   No facility-administered encounter medications on file as of 03/23/2022.      Medical History: Past Medical History:  Diagnosis Date   Asthma    GERD  (gastroesophageal reflux disease)    History of chicken pox    UTI (lower urinary tract infection)      Vital Signs: BP 120/76 (BP Location: Left Arm, Patient Position: Sitting, Cuff Size: Normal)   Pulse 62   Temp 97.6 F (36.4 C) (Tympanic)   Ht '5\' 4"'$  (1.626 m)   Wt 130 lb 9.6 oz (59.2 kg)   SpO2 98%   BMI 22.42 kg/m    Review of Systems  Constitutional: Negative.   HENT:  Positive for congestion, postnasal drip and sinus pressure. Negative for ear pain, rhinorrhea, sneezing, sore throat and trouble swallowing.   Eyes:  Positive for discharge (clear) and redness. Negative for photophobia, pain, itching and visual disturbance.       Matting in the morning  Respiratory:  Positive for cough. Negative for chest tightness, shortness of breath and wheezing.   Cardiovascular: Negative.   Neurological: Negative.     Physical Exam Constitutional:      Appearance: Normal appearance.  HENT:     Head: Atraumatic.     Right Ear: Tympanic membrane, ear canal and external ear normal.     Left Ear: Tympanic membrane, ear canal and external ear normal.     Nose: Nose normal.     Mouth/Throat:     Mouth: Mucous membranes are moist.     Pharynx: Oropharynx is clear.  Eyes:     Extraocular Movements: Extraocular  movements intact.     Conjunctiva/sclera:     Right eye: Right conjunctiva is not injected. No exudate.    Left eye: Left conjunctiva is injected (slight). No exudate.    Comments: +redness of both upper and lower eyelids with very mild swelling. No exudate at the present time.   Cardiovascular:     Rate and Rhythm: Normal rate and regular rhythm.  Pulmonary:     Effort: Pulmonary effort is normal.     Breath sounds: Normal breath sounds.  Musculoskeletal:     Cervical back: Neck supple.  Lymphadenopathy:     Head:     Right side of head: No preauricular or posterior auricular adenopathy.     Left side of head: No preauricular or posterior auricular adenopathy.      Cervical: No cervical adenopathy.  Skin:    General: Skin is warm.  Neurological:     Mental Status: She is alert.  Psychiatric:        Mood and Affect: Mood normal.        Behavior: Behavior normal.        Thought Content: Thought content normal.        Judgment: Judgment normal.       Assessment/Plan:  1. Viral illness  I reviewed my clinical findings with patient.  Will treat her viral illness with otc medicine ie Tylenol sinus an cold. May use Afrin as directed on the box, but no more than 3 consecutive days. Increase fluids Humidifier at home Do not wear contact lenses until eye symptoms resolved. Apply warm compresses to the affected eye. Continue to watch for worsening symptoms Discussed with patient if her left eye worsens then will send an Rx for eye drops. PT will contact me. Pt verbalized understanding and in agreement.   General Counseling: Tonya Carrillo understanding of the findings of todays visit and agrees with plan of treatment. I have discussed any further diagnostic evaluation that may be needed or ordered today. We also reviewed her medications today. she has been encouraged to call the office with any questions or concerns that should arise related to todays visit.    Time spent:[address] [place], [place] Physician Assistant

## 2022-03-24 ENCOUNTER — Ambulatory Visit (INDEPENDENT_AMBULATORY_CARE_PROVIDER_SITE_OTHER): Payer: Self-pay | Admitting: Physician Assistant

## 2022-03-24 DIAGNOSIS — H1033 Unspecified acute conjunctivitis, bilateral: Secondary | ICD-10-CM

## 2022-03-24 MED ORDER — POLYMYXIN B-TRIMETHOPRIM 10000-0.1 UNIT/ML-% OP SOLN: 2.0000 [drp] | Freq: Four times a day (QID) | OPHTHALMIC | 0 refills | Status: AC

## 2022-03-24 NOTE — Progress Notes (Signed)
Virtual Visit Consent   Tonya Carrillo, you are scheduled for a virtual visit with a [facility] provider today. Just as with appointments in the office, your consent must be obtained to participate. Your consent will be active for this visit and any virtual visit you may have with one of our providers in the next 365 days. If you have a MyChart account, a copy of this consent can be sent to you electronically.  As this is a virtual telephone visit which doesn't allow your provider to perform a traditional examination and may limit your provider's ability to fully assess your condition. If your provider identifies any concerns that need to be evaluated in person or the need to arrange testing (such as labs, EKG, etc.), we will make arrangements to do so. Although advances in technology are sophisticated, we cannot ensure that it will always work on either your end or our end. If the connection with a video visit is poor, the visit may have to be switched to a telephone visit. With either a video or telephone visit, we are not always able to ensure that we have a secure connection.  By engaging in this virtual visit, you consent to the provision of healthcare and authorize for your insurance to be billed (if applicable) for the services provided during this visit. Depending on your insurance coverage, you may receive a charge related to this service.  I need to obtain your verbal consent now. Are you willing to proceed with your visit today? Tonya Carrillo has provided verbal consent on 03/24/2022 for a virtual visit (video or telephone). [name], [place]  Date: 03/24/2022 9:36 AM  Virtual Visit via Telephone Note   I, [name], connected with  Tonya Carrillo  ([id number], Aug 01, 1980) on 03/24/22 at 10:00 AM EST by a telephone and verified that I am speaking with the correct person using two identifiers.  Location: Patient: Virtual Visit Location Patient: Home Provider: Virtual Visit  Location Provider: Office/Clinic   I discussed the limitations of evaluation and management by telemedicine and the availability of in person appointments. The patient expressed understanding and agreed to proceed.    History of Present Illness: Tonya Carrillo is a 42 y.o. who identifies as a female who was assigned female at birth, and is being seen today for bilateral eye redness.  HPI: 42 y/o F with a follow up from yesterday. Pt states her congestion is better, but her left eye and now her right eye is getting worse with redness and noticed "yellow" discharge from her left eye. Has been applying otc anti-histamine eye drops without much improvement.     Problems:  Patient Active Problem List   Diagnosis Date Noted   Palpitations 12/31/2021   Cold intolerance 12/31/2021   Alopecia 12/31/2021   Pre-eclampsia, severe, delivered with postpartum condition 09/19/2020   Rh negative, antepartum 02/07/2020   Carrier of genetic disorder 02/07/2020   Family history of trisomy 13 10/03/2019   Exercise-induced asthma 08/22/2018   Situational anxiety 02/04/2015    Allergies:  Allergies  Allergen Reactions   Omega 3-6-9 Fatty Acids [Omega-3 Fusion]     swelliing with throat and dry cough   Other     Almond had swelling in throat and mouth itching, no respiratory   Tylenol [Acetaminophen] Cough   Shellfish Allergy Rash   Medications:  Current Outpatient Medications:    trimethoprim-polymyxin b (POLYTRIM) ophthalmic solution, Place 2 drops into both eyes every 6 (six) hours for  7 days., Disp: 10 mL, Rfl: 0   ALPRAZolam (XANAX) 0.25 MG tablet, Take 0.25 mg by mouth daily as needed. Only when flying, Disp: , Rfl:    CHOLINE PO, 250 mg. 2 tablets by mouth every morning and 1 tablet by mouth every evening, Disp: , Rfl:    Coenzyme Q10 200 MG capsule, Take by mouth., Disp: , Rfl:    ELDERBERRY PO, Take 2 each by mouth daily., Disp: , Rfl:    Omega-3 Fatty Acids (OMEGA 3 PO), Take 1 capsule  by mouth in the morning and at bedtime., Disp: , Rfl:    Prenatal Multivit-Min-Fe-FA (PRENATAL, W/IRON & FA,) 27-0.8 MG TABS, Take 1 tablet by mouth daily., Disp: , Rfl:   Observations/Objective: Patient is well-developed, well-nourished in no acute distress.  Resting comfortably at home.  Head is normocephalic, atraumatic.  No labored breathing.  Speech is clear and coherent with logical content.  Patient is alert and oriented at baseline.    Assessment and Plan: 1. Acute conjunctivitis of both eyes, unspecified acute conjunctivitis type - trimethoprim-polymyxin b (POLYTRIM) ophthalmic solution; Place 2 drops into both eyes every 6 (six) hours for 7 days.  Dispense: 10 mL; Refill: 0  Rx sent to pharmacy. Start using eye drops as prescribed RTC to clinic if symptoms don't improve or worsen Pt verbalized understanding and in agreement.    Follow Up Instructions: I discussed the assessment and treatment plan with the patient. The patient was provided an opportunity to ask questions and all were answered. The patient agreed with the plan and demonstrated an understanding of the instructions.  A copy of instructions were sent to the patient via MyChart unless otherwise noted below.    The patient was advised to call back or seek an in-person evaluation if the symptoms worsen or if the condition fails to improve as anticipated.  Time:  I spent 10 minutes with the patient via telehealth technology discussing the above problems/concerns.    [name], PA-C

## 2022-04-07 DIAGNOSIS — Z13220 Encounter for screening for lipoid disorders: Secondary | ICD-10-CM | POA: Diagnosis not present

## 2022-04-07 DIAGNOSIS — N926 Irregular menstruation, unspecified: Secondary | ICD-10-CM | POA: Diagnosis not present

## 2022-04-07 DIAGNOSIS — Z01411 Encounter for gynecological examination (general) (routine) with abnormal findings: Secondary | ICD-10-CM | POA: Diagnosis not present

## 2022-04-07 DIAGNOSIS — Z1331 Encounter for screening for depression: Secondary | ICD-10-CM | POA: Diagnosis not present

## 2022-04-07 DIAGNOSIS — Z131 Encounter for screening for diabetes mellitus: Secondary | ICD-10-CM | POA: Diagnosis not present

## 2022-04-08 ENCOUNTER — Other Ambulatory Visit: Payer: Self-pay

## 2022-04-08 DIAGNOSIS — Z1231 Encounter for screening mammogram for malignant neoplasm of breast: Secondary | ICD-10-CM

## 2022-04-12 ENCOUNTER — Ambulatory Visit (INDEPENDENT_AMBULATORY_CARE_PROVIDER_SITE_OTHER): Payer: Self-pay | Admitting: Physician Assistant

## 2022-04-12 ENCOUNTER — Encounter: Payer: Self-pay | Admitting: Physician Assistant

## 2022-04-12 VITALS — Temp 98.8°F

## 2022-04-12 DIAGNOSIS — H109 Unspecified conjunctivitis: Secondary | ICD-10-CM

## 2022-04-12 MED ORDER — OFLOXACIN 0.3 % OP SOLN: 1.0000 [drp] | Freq: Four times a day (QID) | OPHTHALMIC | 0 refills | Status: DC

## 2022-04-12 NOTE — Progress Notes (Signed)
[facility] [address], [place] [address]   Office Visit Note  Patient Name: Tonya Carrillo Date of Birth [id number]  Medical Record number [id number]  Date of Service: 04/12/2022  Chief Complaint  Patient presents with   Eye Drainage    Was seen 3 weeks ago. Did the drops for 1 week, didn't wear contacts for 14 days. Woke up this morning with red eyes.      42 y/o F presents to the clinic for c/o left eye redness since earlier today. She denies known exposure to conjunctivitis. Denies purulent d/c. Has been wearing corrective glasses instead of contact lenses. Denies vision changes or photosensitivity. Denies URI symptoms currently. Slight matting earlier today.       Current Medication:  Outpatient Encounter Medications as of 04/12/2022  Medication Sig   ALPRAZolam (XANAX) 0.25 MG tablet Take 0.25 mg by mouth daily as needed. Only when flying   CHOLINE PO 250 mg. 2 tablets by mouth every morning and 1 tablet by mouth every evening   Coenzyme Q10 200 MG capsule Take by mouth.   ELDERBERRY PO Take 2 each by mouth daily.   ofloxacin (OCUFLOX) 0.3 % ophthalmic solution Place 1 drop into the left eye 4 (four) times daily.   Omega-3 Fatty Acids (OMEGA 3 PO) Take 1 capsule by mouth in the morning and at bedtime.   Prenatal Multivit-Min-Fe-FA (PRENATAL, W/IRON & FA,) 27-0.8 MG TABS Take 1 tablet by mouth daily.   No facility-administered encounter medications on file as of 04/12/2022.      Medical History: Past Medical History:  Diagnosis Date   Asthma    GERD (gastroesophageal reflux disease)    History of chicken pox    UTI (lower urinary tract infection)      Vital Signs: Temp 98.8 F (37.1 C) (Tympanic)    Review of Systems  Constitutional: Negative.   Eyes:  Positive for redness. Negative for photophobia, pain, discharge, itching and visual disturbance.  Neurological: Negative.     Physical Exam Constitutional:      Appearance: Normal  appearance.  HENT:     Head: Atraumatic.     Right Ear: Tympanic membrane, ear canal and external ear normal.     Left Ear: Tympanic membrane, ear canal and external ear normal.     Nose: Nose normal.     Mouth/Throat:     Mouth: Mucous membranes are moist.     Pharynx: Oropharynx is clear.  Eyes:     General: Lids are normal.     Extraocular Movements: Extraocular movements intact.     Right eye: Normal extraocular motion.     Left eye: Normal extraocular motion.     Conjunctiva/sclera:     Right eye: Right conjunctiva is not injected. No exudate.    Left eye: Left conjunctiva is injected. No exudate.  Cardiovascular:     Rate and Rhythm: Normal rate and regular rhythm.  Pulmonary:     Effort: Pulmonary effort is normal.     Breath sounds: Normal breath sounds.  Musculoskeletal:     Cervical back: Neck supple.  Skin:    General: Skin is warm.  Neurological:     Mental Status: She is alert.  Psychiatric:        Mood and Affect: Mood normal.        Behavior: Behavior normal.        Thought Content: Thought content normal.        Judgment:  Judgment normal.       Assessment/Plan:  1. Conjunctivitis of left eye, unspecified conjunctivitis type - ofloxacin (OCUFLOX) 0.3 % ophthalmic solution; Place 1 drop into the left eye 4 (four) times daily.  Dispense: 5 mL; Refill: 0  I reviewed my clinical findings with patient.  Recommend to use otc anti-histamine eye drops and take otc oral anti-histamine twice a day. If symptoms don't improve then consider using eye drops as prescribed. Pt verbalized understanding and in agreement.  Briefly discussed a referral to Ophthalmologist if symptoms recur.  Pt verbalized understanding and in agreement.   General Counseling: Tonya Carrillo understanding of the findings of todays visit and agrees with plan of treatment. I have discussed any further diagnostic evaluation that may be needed or ordered today. We also reviewed her  medications today. she has been encouraged to call the office with any questions or concerns that should arise related to todays visit.    Time spent:20 [place], [place] Physician Assistant

## 2022-04-26 DIAGNOSIS — R6882 Decreased libido: Secondary | ICD-10-CM | POA: Diagnosis not present

## 2022-04-26 DIAGNOSIS — Z79899 Other long term (current) drug therapy: Secondary | ICD-10-CM | POA: Diagnosis not present

## 2022-04-26 DIAGNOSIS — R5383 Other fatigue: Secondary | ICD-10-CM | POA: Diagnosis not present

## 2022-04-26 DIAGNOSIS — Z3149 Encounter for other procreative investigation and testing: Secondary | ICD-10-CM | POA: Diagnosis not present

## 2022-04-28 ENCOUNTER — Ambulatory Visit
Admission: RE | Admit: 2022-04-28 | Discharge: 2022-04-28 | Disposition: A | Discharge status: Home / Self Care | Payer: BC Managed Care – PPO | Source: Ambulatory Visit | Attending: Certified Nurse Midwife | Admitting: Certified Nurse Midwife

## 2022-04-28 DIAGNOSIS — Z1231 Encounter for screening mammogram for malignant neoplasm of breast: Secondary | ICD-10-CM | POA: Diagnosis not present

## 2022-05-03 ENCOUNTER — Encounter: Payer: Self-pay | Admitting: Family Medicine

## 2022-05-05 ENCOUNTER — Other Ambulatory Visit: Payer: Self-pay | Admitting: Certified Nurse Midwife

## 2022-05-05 DIAGNOSIS — R928 Other abnormal and inconclusive findings on diagnostic imaging of breast: Secondary | ICD-10-CM

## 2022-05-05 DIAGNOSIS — R921 Mammographic calcification found on diagnostic imaging of breast: Secondary | ICD-10-CM

## 2022-05-11 ENCOUNTER — Ambulatory Visit
Admission: RE | Admit: 2022-05-11 | Discharge: 2022-05-11 | Disposition: A | Discharge status: Home / Self Care | Payer: BC Managed Care – PPO | Source: Ambulatory Visit | Attending: Certified Nurse Midwife | Admitting: Certified Nurse Midwife

## 2022-05-11 DIAGNOSIS — R928 Other abnormal and inconclusive findings on diagnostic imaging of breast: Secondary | ICD-10-CM | POA: Insufficient documentation

## 2022-05-11 DIAGNOSIS — R921 Mammographic calcification found on diagnostic imaging of breast: Secondary | ICD-10-CM | POA: Diagnosis not present

## 2022-05-14 ENCOUNTER — Other Ambulatory Visit: Payer: Self-pay | Admitting: Certified Nurse Midwife

## 2022-05-14 DIAGNOSIS — R928 Other abnormal and inconclusive findings on diagnostic imaging of breast: Secondary | ICD-10-CM

## 2022-05-14 DIAGNOSIS — R921 Mammographic calcification found on diagnostic imaging of breast: Secondary | ICD-10-CM

## 2022-05-19 ENCOUNTER — Ambulatory Visit
Admission: RE | Admit: 2022-05-19 | Discharge: 2022-05-19 | Disposition: A | Discharge status: Home / Self Care | Payer: BC Managed Care – PPO | Source: Ambulatory Visit | Attending: Certified Nurse Midwife | Admitting: Certified Nurse Midwife

## 2022-05-19 DIAGNOSIS — R928 Other abnormal and inconclusive findings on diagnostic imaging of breast: Secondary | ICD-10-CM | POA: Diagnosis not present

## 2022-05-19 DIAGNOSIS — R921 Mammographic calcification found on diagnostic imaging of breast: Secondary | ICD-10-CM | POA: Diagnosis not present

## 2022-05-19 DIAGNOSIS — N6081 Other benign mammary dysplasias of right breast: Secondary | ICD-10-CM | POA: Diagnosis not present

## 2022-05-19 HISTORY — PX: BREAST BIOPSY: SHX20

## 2022-05-19 MED ORDER — LIDOCAINE HCL (PF) 1 % IJ SOLN
20.0000 mg | Freq: Once | INTRAMUSCULAR | Status: AC
  Administered 2022-05-19: 20 mg
  Filled 2022-05-19: qty 2

## 2022-05-20 DIAGNOSIS — R946 Abnormal results of thyroid function studies: Secondary | ICD-10-CM | POA: Diagnosis not present

## 2022-05-20 LAB — SURGICAL PATHOLOGY

## 2022-07-22 DIAGNOSIS — Q998 Other specified chromosome abnormalities: Secondary | ICD-10-CM | POA: Diagnosis not present

## 2022-07-22 DIAGNOSIS — E288 Other ovarian dysfunction: Secondary | ICD-10-CM | POA: Diagnosis not present

## 2022-07-22 DIAGNOSIS — E039 Hypothyroidism, unspecified: Secondary | ICD-10-CM | POA: Diagnosis not present

## 2022-07-22 DIAGNOSIS — N978 Female infertility of other origin: Secondary | ICD-10-CM | POA: Diagnosis not present

## 2022-08-17 DIAGNOSIS — N926 Irregular menstruation, unspecified: Secondary | ICD-10-CM | POA: Diagnosis not present

## 2022-09-16 DIAGNOSIS — E039 Hypothyroidism, unspecified: Secondary | ICD-10-CM | POA: Diagnosis not present

## 2022-09-28 DIAGNOSIS — E039 Hypothyroidism, unspecified: Secondary | ICD-10-CM | POA: Diagnosis not present

## 2022-09-28 DIAGNOSIS — Q998 Other specified chromosome abnormalities: Secondary | ICD-10-CM | POA: Diagnosis not present

## 2022-09-28 DIAGNOSIS — N970 Female infertility associated with anovulation: Secondary | ICD-10-CM | POA: Diagnosis not present

## 2022-09-28 DIAGNOSIS — E288 Other ovarian dysfunction: Secondary | ICD-10-CM | POA: Diagnosis not present

## 2022-10-13 DIAGNOSIS — N970 Female infertility associated with anovulation: Secondary | ICD-10-CM | POA: Diagnosis not present

## 2022-10-13 DIAGNOSIS — E039 Hypothyroidism, unspecified: Secondary | ICD-10-CM | POA: Diagnosis not present

## 2022-11-05 DIAGNOSIS — N970 Female infertility associated with anovulation: Secondary | ICD-10-CM | POA: Diagnosis not present

## 2022-11-05 DIAGNOSIS — Z3141 Encounter for fertility testing: Secondary | ICD-10-CM | POA: Diagnosis not present

## 2022-11-05 DIAGNOSIS — E288 Other ovarian dysfunction: Secondary | ICD-10-CM | POA: Diagnosis not present

## 2022-11-23 ENCOUNTER — Ambulatory Visit: Payer: BC Managed Care – PPO | Admitting: Internal Medicine

## 2022-12-28 ENCOUNTER — Ambulatory Visit: Payer: BC Managed Care – PPO | Admitting: Internal Medicine

## 2022-12-28 ENCOUNTER — Encounter: Payer: Self-pay | Admitting: Internal Medicine

## 2022-12-28 VITALS — BP 110/62 | HR 71 | Temp 98.4°F | Resp 16 | Ht 64.0 in | Wt 127.3 lb

## 2022-12-28 DIAGNOSIS — Z8639 Personal history of other endocrine, nutritional and metabolic disease: Secondary | ICD-10-CM

## 2022-12-28 DIAGNOSIS — F40243 Fear of flying: Secondary | ICD-10-CM | POA: Diagnosis not present

## 2022-12-28 DIAGNOSIS — Z87898 Personal history of other specified conditions: Secondary | ICD-10-CM | POA: Diagnosis not present

## 2022-12-28 DIAGNOSIS — M5432 Sciatica, left side: Secondary | ICD-10-CM

## 2022-12-28 MED ORDER — LORAZEPAM 0.5 MG PO TABS: 0.5000 mg | ORAL_TABLET | Freq: Every day | ORAL | 0 refills | Status: DC | PRN

## 2022-12-28 MED ORDER — TIZANIDINE HCL 4 MG PO TABS
4.0000 mg | ORAL_TABLET | Freq: Every evening | ORAL | 0 refills | Status: DC | PRN
Start: 2022-12-28 — End: 2023-04-26

## 2022-12-28 NOTE — Patient Instructions (Addendum)
It was great seeing you today!  Plan discussed at today's visit: -Referral to PT placed today, recommend taking anti-inflammatories (Ibuprofen, Aleve) 400-600 mg as needed for pain, I am also sending a prescription for a muscle relaxer as well. Stretched provided below -Plan for fasting labs at follow up -As needed anxiety medicine sent to pharmacy   Follow up in: 3 months   Take care and let us know if you have any questions or concerns prior to your next visit.  Dr. Caralee Ates  Sciatica Rehab Ask your health care provider which exercises are safe for you. Do exercises exactly as told by your health care provider and adjust them as directed. It is normal to feel mild stretching, pulling, tightness, or discomfort as you do these exercises. Stop right away if you feel sudden pain or your pain gets worse. Do not begin these exercises until told by your health care provider. Stretching and range-of-motion exercises These exercises warm up your muscles and joints and improve the movement and flexibility of your hips and back. These exercises also help to relieve pain, numbness, and tingling. Sciatic nerve glide  Sit in a chair with your head facing down toward your chest. Place your hands behind your back. Let your shoulders slump forward. Slowly straighten one of your legs while you tilt your head back as if you are looking toward the ceiling. Only straighten your leg as far as you can without making your symptoms worse. Hold this position for __________ seconds. Slowly return your leg and head back to the starting position. Repeat with your other leg. Repeat __________ times. Complete this exercise __________ times a day. Knee to chest with hip adduction and internal rotation  Lie on your back on a firm surface with both legs straight. Bend one of your knees and move it up toward your chest until you feel a gentle stretch in your lower back and buttock. Then, move your knee toward the shoulder  that is on the opposite side from your leg. This is hip adduction and internal rotation. Hold your leg in this position by holding on to the front of your knee. Hold this position for __________ seconds. Slowly return to the starting position. Repeat with your other leg. Repeat __________ times. Complete this exercise __________ times a day. Prone extension on elbows  Lie on your abdomen on a firm surface. A bed may be too soft for this exercise. Prop yourself up on your elbows. Use your arms to help lift your chest up until you feel a gentle stretch in your abdomen and your lower back. This will place some of your body weight on your elbows. If this is uncomfortable, try stacking pillows under your chest. Your hips should stay down, against the surface that you are lying on. Keep your hip and back muscles relaxed. Hold this position for __________ seconds. Slowly relax your upper body and return to the starting position. Repeat __________ times. Complete this exercise __________ times a day. Strengthening exercises These exercises build strength and endurance in your back. Endurance is the ability to use your muscles for a long time, even after they get tired. Pelvic tilt This exercise strengthens the muscles that lie deep in the abdomen. Lie on your back on a firm surface. Bend your knees and keep your feet flat on the surface. Tense your abdominal muscles. Tip your pelvis up toward the ceiling and flatten your lower back into the firm surface. To help with this exercise, you may  place a small towel under your lower back and try to push your back into the towel. Hold this position for __________ seconds. Let your muscles relax completely before you repeat this exercise. Repeat __________ times. Complete this exercise __________ times a day. Alternating arm and leg raises  Get on your hands and knees on a firm surface. If you are on a hard floor, you may want to use padding, such as an  exercise mat, to cushion your knees. Line up your arms and legs. Your hands should be directly below your shoulders, and your knees should be directly below your hips. Lift your left leg behind you. At the same time, raise your right arm and straighten it in front of you. Do not lift your leg higher than your hip. Do not lift your arm higher than your shoulder. Keep your abdominal and back muscles tight. Keep your hips facing the ground. Do not arch your back. Keep your balance carefully, and do not hold your breath. Hold this position for __________ seconds. Slowly return to the starting position. Repeat with your right leg and your left arm. Repeat __________ times. Complete this exercise __________ times a day. Posture and body mechanics Good posture and healthy body mechanics can help to relieve stress in your body's tissues and joints. Body mechanics refers to the movements and positions of your body while you do your daily activities. Posture is part of body mechanics. Good posture means: Your spine is in its natural S-curve position (neutral). Your shoulders are pulled back slightly. Your head is not tipped forward. Follow these guidelines to improve your posture and body mechanics in your everyday activities. Standing  When standing, keep your spine neutral and your feet about hip width apart. Keep a slight bend in your knees. Your ears, shoulders, and hips should line up. When you do a task in which you stand in one place for a long time, place one foot up on a stable object that is 2-4 inches (5-10 cm) high, such as a footstool. This helps keep your spine neutral. Sitting  When sitting, keep your spine neutral and keep your feet flat on the floor. Use a footrest, if necessary, and keep your thighs parallel to the floor. Avoid rounding your shoulders, and avoid tilting your head forward. When working at a desk or a computer, keep your desk at a height where your hands are slightly  lower than your elbows. Slide your chair under your desk so you are close enough to maintain good posture. When working at a computer, place your monitor at a height where you are looking straight ahead and you do not have to tilt your head forward or downward to look at the screen. Resting  When lying down and resting, avoid positions that are most painful for you. If you have pain with activities such as sitting, bending, stooping, or squatting, lie in a position in which your body does not bend very much. For example, avoid curling up on your side with your arms and knees near your chest (fetal position). If you have pain with activities such as standing for a long time or reaching with your arms, lie with your spine in a neutral position and bend your knees slightly. Try the following positions: Lying on your side with a pillow between your knees. Lying on your back with a pillow under your knees. Lifting  When lifting objects, keep your feet at least shoulder width apart and tighten your abdominal muscles.  Bend your knees and hips and keep your spine neutral. It is important to lift using the strength of your legs, not your back. Do not lock your knees straight out. Always ask for help to lift heavy or awkward objects. This information is not intended to replace advice given to you by your health care provider. Make sure you discuss any questions you have with your health care provider. Document Revised: 06/16/2021 Document Reviewed: 06/16/2021 Elsevier Patient Education  2024 [employer].

## 2022-12-28 NOTE — Progress Notes (Signed)
New Patient Office Visit  Subjective    Patient ID: Tonya Carrillo, female    DOB: [birth year]  Age: 42 y.o. MRN: [id number]  CC:  Chief Complaint  Patient presents with   Establish Care    Has only been seeing gyn   Sciatica    Onset 1.5 months left side starts low back radiates down leg   Labs Only    Postpartum thyroid issues    HPI Tonya Carrillo presents to establish care.  She has no chronic medical conditions and does not take any medications regularly.  Sciatica:  -Symptoms started 1.5 months ago after twisting her back and hip. -Symptoms started around the left piriformis and radiated down the back of her leg now pain is radiating all the way down to the arch of her foot -Has not taken any over-the-counter medications has been diligently working on stretching (patient is a Marine scientist) but has not noticed much improvement  Postpartum Thyroid:  -Patient first noticed symptoms of hypothyroid in August 2023.  Symptoms included hair loss, cold intolerance, weight loss and fatigue.  At this time patient was about 14 months postpartum.  TSH at the time was normal -TSH 2/24 5.100, was then treated with Levothyroxine and repeat lab was improved at 1.071.  She was eventually tapered off the medication and is currently not on anything now -Her thyroid symptoms have improved and she currently denies symptoms but once routine thyroid laboratory checks -Family history of hypothyroidism in her mother as well   History of Pre-Diabetes: -A1c 1/24 5.7, repeat in 6/24 was 5.3%  Anxiety associated with flying: -Patient had been prescribed Ativan 0.25 mg to take as needed while flying in the past.  Patient states she was prescribed 4 pills 2 years ago and has only had to take it twice  Health Maintenance:  -Blood work UTD -Mammogram 2/24 BI-RADS 4 requiring biopsy which was consistent with benign mammary parenchyma with dense stromal fibrosis and focal microcalcifications  but negative for atypical proliferative breast disease  Outpatient Encounter Medications as of 12/28/2022  Medication Sig   Coenzyme Q10 200 MG capsule Take by mouth.   LORazepam (ATIVAN) 0.5 MG tablet Take 1 tablet (0.5 mg total) by mouth daily as needed for anxiety (for flying anxiety).   Prenatal Multivit-Min-Fe-FA (PRENATAL, W/IRON & FA,) 27-0.8 MG TABS Take 1 tablet by mouth daily.   tiZANidine (ZANAFLEX) 4 MG tablet Take 1 tablet (4 mg total) by mouth at bedtime as needed for muscle spasms.   [DISCONTINUED] ALPRAZolam (XANAX) 0.25 MG tablet Take 0.25 mg by mouth daily as needed. Only when flying   [DISCONTINUED] CHOLINE PO 250 mg. 2 tablets by mouth every morning and 1 tablet by mouth every evening   [DISCONTINUED] ELDERBERRY PO Take 2 each by mouth daily.   [DISCONTINUED] ofloxacin (OCUFLOX) 0.3 % ophthalmic solution Place 1 drop into the left eye 4 (four) times daily.   [DISCONTINUED] Omega-3 Fatty Acids (OMEGA 3 PO) Take 1 capsule by mouth in the morning and at bedtime.   No facility-administered encounter medications on file as of 12/28/2022.    Past Medical History:  Diagnosis Date   Allergy 2010   seasonal allergies   Anxiety 2005   panic attacks   Asthma    GERD (gastroesophageal reflux disease)    History of chicken pox    Thyroid disease 2024   hypothyroidism   UTI (lower urinary tract infection)     Past Surgical History:  Procedure Laterality  Date   BREAST BIOPSY Right 05/19/2022   rt stereo, calcs, coil clip, path pending.   BREAST BIOPSY Right 05/19/2022   MM RT BREAST BX W LOC DEV 1ST LESION IMAGE BX SPEC STEREO GUIDE 05/19/2022 [facility]   BREAST SURGERY  03/22/2002   cyst removed   NECK SURGERY  03/22/2005   cyst removed from left side of neck   TONSILLECTOMY      Family History  Problem Relation Age of Onset   Stroke Mother    Hypertension Mother    Hyperlipidemia Mother    Thyroid disease Mother    Cancer Mother    Cancer Father         multiple myeloma   Breast cancer Maternal Grandmother 85   Cancer Maternal Grandmother        breast   Cancer Maternal Grandfather 69       colon    ALS Paternal Grandfather    Obesity Brother    Cancer Maternal Aunt     Social History   Socioeconomic History   Marital status: Married    Spouse name: Not on file   Number of children: 0   Years of education: Not on file   Highest education level: Doctorate  Occupational History   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    Employer: [employer]  Tobacco Use   Smoking status: Former    Current packs/day: 0.00    Average packs/day: 1 pack/day for 4.0 years (4.0 ttl pk-yrs)    Types: Cigarettes    Quit date: 03/23/2003    Years since quitting: 19.7   Smokeless tobacco: Never  Vaping Use   Vaping status: Never Used  Substance and Sexual Activity   Alcohol use: Yes    Alcohol/week: 3.0 standard drinks of alcohol    Types: 2 Glasses of wine, 1 Cans of beer per week    Comment: wine on weekend   Drug use: No   Sexual activity: Yes    Birth control/protection: Condom  Other Topics Concern   Not on file  Social History Narrative   Diet Vegetarian.. Eats a lot of protein limits sweets      Regular exercise running, weights, yoga   Social Determinants of Health   Financial Resource Strain: Low Risk  (12/24/2022)   Overall Financial Resource Strain (CARDIA)    Difficulty of Paying Living Expenses: Not hard at all  Food Insecurity: No Food Insecurity (12/24/2022)   Hunger Vital Sign    Worried About Running Out of Food in the Last Year: Never true    Ran Out of Food in the Last Year: Never true  Transportation Needs: No Transportation Needs (12/24/2022)   PRAPARE - Administrator, Civil Service (Medical): No    Lack of Transportation (Non-Medical): No  Physical Activity: Sufficiently Active (12/24/2022)   Exercise Vital Sign    Days of Exercise per Week: 7 days    Minutes of Exercise per Session: 40 min  Stress: No Stress  Concern Present (12/24/2022)   [employer] of Occupational Health - Occupational Stress Questionnaire    Feeling of Stress : Only a little  Social Connections: Moderately Isolated (12/24/2022)   Social Connection and Isolation Panel [NHANES]    Frequency of Communication with Friends and Family: Never    Frequency of Social Gatherings with Friends and Family: Once a week    Attends Religious Services: 1 to 4 times per year    Active Member of Clubs or Organizations: No  Attends Banker Meetings: Not on file    Marital Status: Married  Intimate Partner Violence: Not on file    Review of Systems  All other systems reviewed and are negative.       Objective    BP 110/62   Pulse 71   Temp 98.4 F (36.9 C)   Resp 16   Ht 5\' 4"  (1.626 m)   Wt 127 lb 4.8 oz (57.7 kg)   LMP 11/23/2022   SpO2 99%   BMI 21.85 kg/m   Physical Exam Constitutional:      Appearance: Normal appearance.  HENT:     Head: Normocephalic and atraumatic.     Mouth/Throat:     Mouth: Mucous membranes are moist.     Pharynx: Oropharynx is clear.  Eyes:     Extraocular Movements: Extraocular movements intact.     Conjunctiva/sclera: Conjunctivae normal.     Pupils: Pupils are equal, round, and reactive to light.  Neck:     Comments: No thyromegaly Cardiovascular:     Rate and Rhythm: Normal rate and regular rhythm.  Pulmonary:     Effort: Pulmonary effort is normal.     Breath sounds: Normal breath sounds.  Musculoskeletal:     Cervical back: No tenderness.     Right lower leg: No edema.     Left lower leg: No edema.     Comments: Limited range of motion in the lumbar spine with flexion, no hypertonicity within the left piriformis but very tight left hamstrings  Lymphadenopathy:     Cervical: No cervical adenopathy.  Skin:    General: Skin is warm and dry.  Neurological:     General: No focal deficit present.     Mental Status: She is alert. Mental status is at baseline.   Psychiatric:        Mood and Affect: Mood normal.        Behavior: Behavior normal.         Assessment & Plan:   1. Sciatica of left side: Recommend anti-inflammatories as needed and will prescribe muscle relaxer to take if needed, however I think this patient could do really well with physical therapy, referral placed.  Generic sciatica stretches printed for the patient, however I do think a lot of her symptoms are coming from her hamstring at this point.  - Ambulatory referral to Physical Therapy - tiZANidine (ZANAFLEX) 4 MG tablet; Take 1 tablet (4 mg total) by mouth at bedtime as needed for muscle spasms.  Dispense: 30 tablet; Refill: 0  2. History of thyroid disease: Currently asymptomatic, reviewed past thyroid labs.  Plan to recheck labs at follow-up in 3 months.  3. History of prediabetes: Recheck A1c at follow-up.  4. Anxiety with flying: PDMP reviewed, prescription for Ativan 0.5 mg sent to pharmacy to take as needed when flying.    - LORazepam (ATIVAN) 0.5 MG tablet; Take 1 tablet (0.5 mg total) by mouth daily as needed for anxiety (for flying anxiety).  Dispense: 10 tablet; Refill: 0   Return in about 3 months (around 03/30/2023) for but after 1/17 please .   Margarita Mail, DO

## 2023-01-28 ENCOUNTER — Other Ambulatory Visit: Payer: Self-pay

## 2023-01-28 ENCOUNTER — Encounter: Payer: Self-pay | Admitting: Physician Assistant

## 2023-01-28 ENCOUNTER — Ambulatory Visit (INDEPENDENT_AMBULATORY_CARE_PROVIDER_SITE_OTHER): Payer: Self-pay | Admitting: Physician Assistant

## 2023-01-28 VITALS — BP 100/70 | HR 53 | Temp 96.1°F | Ht 64.0 in | Wt 123.0 lb

## 2023-01-28 DIAGNOSIS — H8393 Unspecified disease of inner ear, bilateral: Secondary | ICD-10-CM

## 2023-01-28 NOTE — Progress Notes (Signed)
[facility] [address] [place], [place] [address]   Office Visit Note  Patient Name: Tonya Carrillo Date of Birth [id number]  Medical Record number [id number]  Date of Service: 01/28/2023  Chief Complaint  Patient presents with   Acute Visit    Patient c/o BL ear pressure, worse on the right, and a slightly sore throat. Symptoms began this morning. She states other family members have been sick over the past month.      42 y/o F presents to the clinic for c/o b/l ear discomfort x earlier today. She denies recent air travel. No open water activities. Denies ear drainage. She denies ear trauma. Feels like pressure type pain. Has used Afrin nasal spray in the past and has helped her ears and congestion. Hasn't used it recently. Denies cough, chills, fever. Pt has h/o ear multiple ear infections and wants to make sure she doesn't have an active infection.       Current Medication:  Outpatient Encounter Medications as of 01/28/2023  Medication Sig   Coenzyme Q10 200 MG capsule Take by mouth daily.   LORazepam (ATIVAN) 0.5 MG tablet Take 1 tablet (0.5 mg total) by mouth daily as needed for anxiety (for flying anxiety).   Prenatal Multivit-Min-Fe-FA (PRENATAL, W/IRON & FA,) 27-0.8 MG TABS Take 1 tablet by mouth daily.   tiZANidine (ZANAFLEX) 4 MG tablet Take 1 tablet (4 mg total) by mouth at bedtime as needed for muscle spasms.   No facility-administered encounter medications on file as of 01/28/2023.      Medical History: Past Medical History:  Diagnosis Date   Allergy 2010   seasonal allergies   Anxiety 2005   panic attacks   Asthma    GERD (gastroesophageal reflux disease)    History of chicken pox    Thyroid disease 2024   hypothyroidism   UTI (lower urinary tract infection)      Vital Signs: BP 100/70   Pulse (!) 53   Temp (!) 96.1 F (35.6 C)   Ht 5\' 4"  (1.626 m)   Wt 123 lb (55.8 kg)   SpO2 99%   BMI 21.11 kg/m    Review of Systems   Constitutional: Negative.   HENT: Negative.  Negative for ear discharge and ear pain (ear discomfort).   Respiratory: Negative.    Cardiovascular: Negative.   Neurological: Negative.     Physical Exam Constitutional:      Appearance: Normal appearance.  HENT:     Head: Atraumatic.     Right Ear: Tympanic membrane, ear canal and external ear normal. Tympanic membrane is not perforated, erythematous, retracted or bulging.     Left Ear: Tympanic membrane, ear canal and external ear normal. Tympanic membrane is not perforated, erythematous, retracted or bulging.     Ears:     Comments: Both TM appear dull with some air-fluid levels. No signs of infection.     Nose: Nose normal.     Mouth/Throat:     Mouth: Mucous membranes are moist.     Pharynx: Oropharynx is clear.  Eyes:     Extraocular Movements: Extraocular movements intact.  Cardiovascular:     Rate and Rhythm: Regular rhythm. Bradycardia present.  Pulmonary:     Effort: Pulmonary effort is normal.     Breath sounds: Normal breath sounds.  Musculoskeletal:     Cervical back: Neck supple.  Skin:    General: Skin is warm.  Neurological:     Mental Status: She  is alert.  Psychiatric:        Mood and Affect: Mood normal.        Behavior: Behavior normal.        Thought Content: Thought content normal.        Judgment: Judgment normal.       Assessment/Plan:  1. Inner ear dysfunction, bilateral  Use otc Afrin as directed on the box Take otc oral anti-histamine Use nasal lavage Steam from the warm/hot shower will help decongestion Pt prefers not to take oral decongestant ie Sudafed for side effects. Briefly discussed oral steroids if symptoms don't improve or worsen. Pt verbalized understanding and in agreement.   General Counseling: jiali morning understanding of the findings of todays visit and agrees with plan of treatment. I have discussed any further diagnostic evaluation that may be needed or ordered  today. We also reviewed her medications today. she has been encouraged to call the office with any questions or concerns that should arise related to todays visit.    Time spent:20 Minutes    Gilberto Better, [place] Physician Assistant

## 2023-04-11 ENCOUNTER — Ambulatory Visit: Payer: BC Managed Care – PPO | Admitting: Internal Medicine

## 2023-04-19 ENCOUNTER — Other Ambulatory Visit: Payer: Self-pay | Admitting: Certified Nurse Midwife

## 2023-04-19 DIAGNOSIS — Z124 Encounter for screening for malignant neoplasm of cervix: Secondary | ICD-10-CM | POA: Diagnosis not present

## 2023-04-19 DIAGNOSIS — Z01411 Encounter for gynecological examination (general) (routine) with abnormal findings: Secondary | ICD-10-CM | POA: Diagnosis not present

## 2023-04-19 DIAGNOSIS — R21 Rash and other nonspecific skin eruption: Secondary | ICD-10-CM | POA: Diagnosis not present

## 2023-04-19 DIAGNOSIS — Z1231 Encounter for screening mammogram for malignant neoplasm of breast: Secondary | ICD-10-CM

## 2023-04-19 LAB — HM PAP SMEAR: HM Pap smear: NEGATIVE

## 2023-04-26 ENCOUNTER — Other Ambulatory Visit: Payer: Self-pay

## 2023-04-26 ENCOUNTER — Ambulatory Visit: Payer: BC Managed Care – PPO | Admitting: Internal Medicine

## 2023-04-26 ENCOUNTER — Encounter: Payer: Self-pay | Admitting: Internal Medicine

## 2023-04-26 VITALS — BP 120/72 | HR 94 | Temp 97.9°F | Resp 16 | Ht 64.0 in | Wt 127.1 lb

## 2023-04-26 DIAGNOSIS — R5383 Other fatigue: Secondary | ICD-10-CM | POA: Diagnosis not present

## 2023-04-26 DIAGNOSIS — Z13220 Encounter for screening for lipoid disorders: Secondary | ICD-10-CM

## 2023-04-26 DIAGNOSIS — E559 Vitamin D deficiency, unspecified: Secondary | ICD-10-CM

## 2023-04-26 DIAGNOSIS — Z8639 Personal history of other endocrine, nutritional and metabolic disease: Secondary | ICD-10-CM | POA: Diagnosis not present

## 2023-04-26 DIAGNOSIS — Z87898 Personal history of other specified conditions: Secondary | ICD-10-CM

## 2023-04-26 DIAGNOSIS — F40243 Fear of flying: Secondary | ICD-10-CM

## 2023-04-26 DIAGNOSIS — R7303 Prediabetes: Secondary | ICD-10-CM | POA: Diagnosis not present

## 2023-04-26 MED ORDER — ALPRAZOLAM 0.25 MG PO TABS: 0.2500 mg | ORAL_TABLET | Freq: Every day | ORAL | 0 refills | Status: DC | PRN

## 2023-04-26 NOTE — Progress Notes (Signed)
 Established Patient Office Visit  Subjective   Patient ID: Tonya Carrillo, female    DOB: [birth year]  Age: 43 y.o. MRN: [id number]  Chief Complaint  Patient presents with   Medical Management of Chronic Issues    3 month recheck    HPI  Tonya Carrillo presents to follow up.   Postpartum Thyroid :  -Patient first noticed symptoms of hypothyroid in August 2023.  Symptoms included hair loss, cold intolerance, weight loss and fatigue.  At this time patient was about 14 months postpartum.  TSH at the time was normal -TSH 2/24 5.100, was then treated with Levothyroxine and repeat lab was improved at 1.071.  She was eventually tapered off the medication and is currently not on anything now -Her thyroid  symptoms have improved and she currently denies symptoms wants routine thyroid  laboratory checks -Family history of hypothyroidism in her mother as well   History of Pre-Diabetes: -A1c 1/24 5.7, repeat in 6/24 was 5.3%  Anxiety associated with flying: -Patient had been prescribed Xanax  0.25 mg to take as needed while flying in the past.  Patient states she was prescribed 4 pills 2 years ago and has only had to take it twice  Health Maintenance:  -Blood work due -Mammogram 2/24 BI-RADS 4 requiring biopsy which was consistent with benign mammary parenchyma with dense stromal fibrosis and focal microcalcifications but negative for atypical proliferative breast disease - has another one schedule later this month    Review of Systems  All other systems reviewed and are negative.     Objective:     BP 120/72 (Cuff Size: Normal)   Pulse 94   Temp 97.9 F (36.6 C) (Oral)   Resp 16   Ht 5' 4 (1.626 m)   Wt 127 lb 1.6 oz (57.7 kg)   SpO2 98%   BMI 21.82 kg/m    Physical Exam Constitutional:      Appearance: Normal appearance.  HENT:     Head: Normocephalic and atraumatic.     Mouth/Throat:     Mouth: Mucous membranes are moist.     Pharynx: Oropharynx is clear.   Eyes:     Extraocular Movements: Extraocular movements intact.     Conjunctiva/sclera: Conjunctivae normal.     Pupils: Pupils are equal, round, and reactive to light.  Neck:     Comments: No thyromegaly Cardiovascular:     Rate and Rhythm: Normal rate and regular rhythm.  Pulmonary:     Effort: Pulmonary effort is normal.     Breath sounds: Normal breath sounds.  Musculoskeletal:     Cervical back: No tenderness.     Right lower leg: No edema.     Left lower leg: No edema.  Lymphadenopathy:     Cervical: No cervical adenopathy.  Skin:    General: Skin is warm and dry.  Neurological:     General: No focal deficit present.     Mental Status: She is alert. Mental status is at baseline.  Psychiatric:        Mood and Affect: Mood normal.        Behavior: Behavior normal.      Results for orders placed or performed in visit on 04/26/23  HM PAP SMEAR  Result Value Ref Range   HM Pap smear negative       The ASCVD Risk score (Arnett DK, et al., 2019) failed to calculate for the following reasons:   Cannot find a previous HDL lab   Cannot find  a previous total cholesterol lab    Assessment & Plan:   1. History of thyroid  disease (Primary): Recheck thyroid  labs today.   - Thyroid  Panel With TSH  2. History of prediabetes: Check A1c.   - HgB A1c  3. Lipid screening: Check fasting cholesterol today.  - Lipid Profile  4. Other fatigue: CBC and CMP ordered.   - CBC w/Diff/Platelet - COMPLETE METABOLIC PANEL WITH GFR  5. Vitamin D  deficiency: Recheck levels.  - Vitamin D  (25 hydroxy)  6. Anxiety with flying: Patient requesting prescription change from Ativan  to Xanax  0.25 mg as needed for anxiety, had a side effect to Ativan  in the past.  - ALPRAZolam  (XANAX ) 0.25 MG tablet; Take 1 tablet (0.25 mg total) by mouth daily as needed for anxiety.  Dispense: 20 tablet; Refill: 0   Return in about 1 year (around 04/25/2024).    Sharyle Fischer, DO

## 2023-04-27 LAB — COMPLETE METABOLIC PANEL WITH GFR
AG Ratio: 1.7 (calc) (ref 1.0–2.5)
ALT: 25 U/L (ref 6–29)
AST: 37 U/L — ABNORMAL HIGH (ref 10–30)
Albumin: 4.3 g/dL (ref 3.6–5.1)
Alkaline phosphatase (APISO): 67 U/L (ref 31–125)
BUN: 10 mg/dL (ref 7–25)
CO2: 30 mmol/L (ref 20–32)
Calcium: 9.1 mg/dL (ref 8.6–10.2)
Chloride: 100 mmol/L (ref 98–110)
Creat: 0.76 mg/dL (ref 0.50–0.99)
Globulin: 2.6 g/dL (ref 1.9–3.7)
Glucose, Bld: 88 mg/dL (ref 65–99)
Potassium: 4.2 mmol/L (ref 3.5–5.3)
Sodium: 139 mmol/L (ref 135–146)
Total Bilirubin: 0.4 mg/dL (ref 0.2–1.2)
Total Protein: 6.9 g/dL (ref 6.1–8.1)
eGFR: 100 mL/min/{1.73_m2} (ref >=60)

## 2023-04-27 LAB — CBC WITH DIFFERENTIAL/PLATELET
Absolute Lymphocytes: 2254 {cells}/uL (ref 850–3900)
Absolute Monocytes: 576 {cells}/uL (ref 200–950)
Basophils Absolute: 58 {cells}/uL (ref 0–200)
Basophils Relative: 0.8 %
Eosinophils Absolute: 108 {cells}/uL (ref 15–500)
Eosinophils Relative: 1.5 %
HCT: 39.5 % (ref 35.0–45.0)
Hemoglobin: 13.1 g/dL (ref 11.7–15.5)
MCH: 30.3 pg (ref 27.0–33.0)
MCHC: 33.2 g/dL (ref 32.0–36.0)
MCV: 91.2 fL (ref 80.0–100.0)
MPV: 9.3 fL (ref 7.5–12.5)
Monocytes Relative: 8 %
Neutro Abs: 4205 {cells}/uL (ref 1500–7800)
Neutrophils Relative %: 58.4 %
Platelets: 392 10*3/uL (ref 140–400)
RBC: 4.33 10*6/uL (ref 3.80–5.10)
RDW: 12.1 % (ref 11.0–15.0)
Total Lymphocyte: 31.3 %
WBC: 7.2 10*3/uL (ref 3.8–10.8)

## 2023-04-27 LAB — HEMOGLOBIN A1C
Hgb A1c MFr Bld: 5.4 %{Hb} (ref <5.7)
Mean Plasma Glucose: 108 mg/dL
eAG (mmol/L): 6 mmol/L

## 2023-04-27 LAB — THYROID PANEL WITH TSH
Free Thyroxine Index: 2.7 (ref 1.4–3.8)
T3 Uptake: 33 % (ref 22–35)
T4, Total: 8.3 ug/dL (ref 5.1–11.9)
TSH: 2.35 m[IU]/L

## 2023-04-27 LAB — LIPID PANEL
Cholesterol: 168 mg/dL (ref <200)
HDL: 78 mg/dL (ref >=50)
LDL Cholesterol (Calc): 77 mg/dL
Non-HDL Cholesterol (Calc): 90 mg/dL (ref <130)
Total CHOL/HDL Ratio: 2.2 (calc) (ref <5.0)
Triglycerides: 57 mg/dL (ref <150)

## 2023-04-27 LAB — VITAMIN D 25 HYDROXY (VIT D DEFICIENCY, FRACTURES): Vit D, 25-Hydroxy: 81 ng/mL (ref 30–100)

## 2023-05-18 ENCOUNTER — Ambulatory Visit
Admission: RE | Admit: 2023-05-18 | Discharge: 2023-05-18 | Disposition: A | Discharge status: Home / Self Care | Payer: BC Managed Care – PPO | Source: Ambulatory Visit | Attending: Certified Nurse Midwife | Admitting: Certified Nurse Midwife

## 2023-05-18 DIAGNOSIS — Z1231 Encounter for screening mammogram for malignant neoplasm of breast: Secondary | ICD-10-CM | POA: Diagnosis not present

## 2023-06-07 DIAGNOSIS — D2262 Melanocytic nevi of left upper limb, including shoulder: Secondary | ICD-10-CM | POA: Diagnosis not present

## 2023-06-07 DIAGNOSIS — D2261 Melanocytic nevi of right upper limb, including shoulder: Secondary | ICD-10-CM | POA: Diagnosis not present

## 2023-06-07 DIAGNOSIS — D2272 Melanocytic nevi of left lower limb, including hip: Secondary | ICD-10-CM | POA: Diagnosis not present

## 2023-06-07 DIAGNOSIS — D225 Melanocytic nevi of trunk: Secondary | ICD-10-CM | POA: Diagnosis not present

## 2023-07-21 DIAGNOSIS — E039 Hypothyroidism, unspecified: Secondary | ICD-10-CM | POA: Diagnosis not present

## 2023-08-11 ENCOUNTER — Ambulatory Visit (INDEPENDENT_AMBULATORY_CARE_PROVIDER_SITE_OTHER): Payer: Self-pay | Admitting: Adult Health

## 2023-08-11 ENCOUNTER — Other Ambulatory Visit: Payer: Self-pay

## 2023-08-11 ENCOUNTER — Encounter: Payer: Self-pay | Admitting: Adult Health

## 2023-08-11 VITALS — BP 122/72 | HR 44 | Temp 95.4°F | Ht 64.0 in | Wt 124.0 lb

## 2023-08-11 DIAGNOSIS — R051 Acute cough: Secondary | ICD-10-CM

## 2023-08-11 MED ORDER — ALBUTEROL SULFATE HFA 108 (90 BASE) MCG/ACT IN AERS: 2.0000 | INHALATION_SPRAY | Freq: Four times a day (QID) | RESPIRATORY_TRACT | 0 refills | Status: AC | PRN

## 2023-08-11 NOTE — Progress Notes (Signed)
 [facility] [address] [place], [place] [address]   Office Visit Note  Patient Name: Tonya Carrillo Date of Birth [id number]  Medical Record number [id number]  Date of Service: 08/11/2023  Chief Complaint  Patient presents with   Acute Visit    Patient states she gets occasional chest tightness from seasonal allergies and used to have an albuterol  inhaler that was helpful. She is requesting a prescription for a rescue inhaler.     HPI Pt is here for a sick visit. She is seeking a refill of her albuterol  inhaler.  She has been having allergy symptoms, and sinus congestion.  She is also running more, and thinks this is attributing to it as well.    Current Medication:  Outpatient Encounter Medications as of 08/11/2023  Medication Sig   albuterol  (VENTOLIN  HFA) 108 (90 Base) MCG/ACT inhaler Inhale 2 puffs into the lungs every 6 (six) hours as needed for wheezing or shortness of breath.   ALPRAZolam  (XANAX ) 0.25 MG tablet Take 1 tablet (0.25 mg total) by mouth daily as needed for anxiety.   BIOTIN PO Take by mouth daily.   Coenzyme Q10 200 MG capsule Take by mouth daily.   ELDERBERRY PO Take by mouth daily.   Multiple Vitamins-Minerals (MULTIVITAMIN WITH MINERALS) tablet Take 1 tablet by mouth daily.   norgestimate-ethinyl estradiol  (ORTHO-CYCLEN) 0.25-35 MG-MCG tablet Take 1 tablet by mouth daily.   TURMERIC PO Take by mouth daily.   [DISCONTINUED] Norethindrone Acetate-Ethinyl Estrad-FE (LOESTRIN 24 FE) 1-20 MG-MCG(24) tablet Take 1 tablet by mouth daily.   [DISCONTINUED] Prenatal Multivit-Min-Fe-FA (PRENATAL, W/IRON & FA,) 27-0.8 MG TABS Take 1 tablet by mouth daily. (Patient not taking: Reported on 04/26/2023)   No facility-administered encounter medications on file as of 08/11/2023.      Medical History: Past Medical History:  Diagnosis Date   Allergy 2010   seasonal allergies   Anxiety 2005   panic attacks   Asthma    GERD (gastroesophageal reflux disease)     History of chicken pox    Thyroid  disease 2024   hypothyroidism   UTI (lower urinary tract infection)      Vital Signs: BP 122/72   Pulse (!) 44 Comment: runs for exercise  Temp (!) 95.4 F (35.2 C)   Ht 5\' 4"  (1.626 m)   Wt 124 lb (56.2 kg)   SpO2 99%   BMI 21.28 kg/m    Review of Systems  Constitutional:  Negative for chills, fatigue and fever.  Respiratory:  Positive for shortness of breath.     Physical Exam Vitals reviewed.  Constitutional:      Appearance: Normal appearance.  HENT:     Head: Normocephalic.     Right Ear: Tympanic membrane and ear canal normal.     Left Ear: Tympanic membrane and ear canal normal.  Neurological:     Mental Status: She is alert.    Assessment/Plan: 1. Acute cough (Primary) Use albuterol  as discussed.   - albuterol  (VENTOLIN  HFA) 108 (90 Base) MCG/ACT inhaler; Inhale 2 puffs into the lungs every 6 (six) hours as needed for wheezing or shortness of breath.  Dispense: 8.5 g; Refill: 0     General Counseling: [name] verbalizes understanding of the findings of todays visit and agrees with plan of treatment. I have discussed any further diagnostic evaluation that may be needed or ordered today. We also reviewed her medications today. she has been encouraged to call the office with any questions or concerns  that should arise related to todays visit.   No orders of the defined types were placed in this encounter.   Meds ordered this encounter  Medications   albuterol  (VENTOLIN  HFA) 108 (90 Base) MCG/ACT inhaler    Sig: Inhale 2 puffs into the lungs every 6 (six) hours as needed for wheezing or shortness of breath.    Dispense:  8.5 g    Refill:  0    Time spent:15 Minutes    Sheria Dills AGNP-C Nurse Practitioner

## 2024-01-06 ENCOUNTER — Ambulatory Visit: Payer: Self-pay | Admitting: Adult Health

## 2024-01-06 DIAGNOSIS — L739 Follicular disorder, unspecified: Secondary | ICD-10-CM

## 2024-01-06 MED ORDER — DOXYCYCLINE HYCLATE 100 MG PO TABS: 100.0000 mg | ORAL_TABLET | Freq: Two times a day (BID) | ORAL | 0 refills | Status: DC

## 2024-01-06 NOTE — Progress Notes (Signed)
 Virtual Visit Consent   Deshanti Adcox [place], you are scheduled for a virtual visit with a [facility] provider today. Just as with appointments in the office, your consent must be obtained to participate.   I need to obtain your verbal consent now. Are you willing to proceed with your visit today? Larysa A Heying has provided verbal consent on 01/06/2024 for a virtual visit (video or telephone). Juliene JINNY Howells, NP  Date: 01/06/2024 8:28 AM  Virtual Visit via Video Note   I, Juliene JINNY Howells, connected with  Tonya Carrillo  ([id number], 1981-02-27) on 01/06/24 at  8:00 AM EDT by telephone and verified that I am speaking with the correct person using two identifiers.  Location: Patient: Virtual Visit Location Patient: Home Provider: Virtual Visit Location Provider: Office/Clinic   I discussed the limitations of evaluation and management by telemedicine and the availability of in person appointments. The patient expressed understanding and agreed to proceed.    History of Present Illness: Tonya Carrillo is a 43 y.o. and is being seen today reporting her son and child are currently being treated for a folliculitis.  She now has a spot on her glute.  She has a history of MRSA from 20 years ago.   HPI: HPI  Problems:  Patient Active Problem List   Diagnosis Date Noted   Palpitations 12/31/2021   Cold intolerance 12/31/2021   Alopecia 12/31/2021   Pre-eclampsia, severe, delivered with postpartum condition 09/19/2020   Rh negative, antepartum 02/07/2020   Carrier of genetic disorder 02/07/2020   Family history of trisomy 13 10/03/2019   Exercise-induced asthma 08/22/2018   Situational anxiety 02/04/2015    Allergies:  Allergies  Allergen Reactions   Omega 3-6-9 Fatty Acids [Omega-3 Fusion]     swelliing with throat and dry cough   Other     Almond had swelling in throat and mouth itching, no respiratory   Tylenol [Acetaminophen] Cough   Shellfish Allergy Rash    Medications:  Current Outpatient Medications:    doxycycline (VIBRA-TABS) 100 MG tablet, Take 1 tablet (100 mg total) by mouth 2 (two) times daily., Disp: 20 tablet, Rfl: 0   albuterol  (VENTOLIN  HFA) 108 (90 Base) MCG/ACT inhaler, Inhale 2 puffs into the lungs every 6 (six) hours as needed for wheezing or shortness of breath., Disp: 8.5 g, Rfl: 0   ALPRAZolam  (XANAX ) 0.25 MG tablet, Take 1 tablet (0.25 mg total) by mouth daily as needed for anxiety., Disp: 20 tablet, Rfl: 0   BIOTIN PO, Take by mouth daily., Disp: , Rfl:    Coenzyme Q10 200 MG capsule, Take by mouth daily., Disp: , Rfl:    ELDERBERRY PO, Take by mouth daily., Disp: , Rfl:    Multiple Vitamins-Minerals (MULTIVITAMIN WITH MINERALS) tablet, Take 1 tablet by mouth daily., Disp: , Rfl:    norgestimate-ethinyl estradiol  (ORTHO-CYCLEN) 0.25-35 MG-MCG tablet, Take 1 tablet by mouth daily., Disp: , Rfl:    TURMERIC PO, Take by mouth daily., Disp: , Rfl:   Observations/Objective: No labored breathing.  Speech is clear and coherent with logical content.  Patient is alert and oriented at baseline.    Assessment and Plan: 1. Folliculitis (Primary) - doxycycline (VIBRA-TABS) 100 MG tablet; Take 1 tablet (100 mg total) by mouth 2 (two) times daily.  Dispense: 20 tablet; Refill: 0 Take Doxy as discussed and follow up if symptoms fail to improve.    Follow Up Instructions: I discussed the assessment and treatment plan with the patient. The patient  was provided an opportunity to ask questions and all were answered. The patient agreed with the plan and demonstrated an understanding of the instructions.    The patient was advised to call back or seek an in-person evaluation if the symptoms worsen or if the condition fails to improve as anticipated.  Time:  I spent 10 minutes with the patient via telehealth technology discussing the above problems/concerns.    Juliene JINNY Howells, NP

## 2024-02-06 ENCOUNTER — Other Ambulatory Visit: Payer: Self-pay

## 2024-02-06 ENCOUNTER — Ambulatory Visit: Payer: Self-pay | Admitting: Medical

## 2024-02-06 ENCOUNTER — Encounter: Payer: Self-pay | Admitting: Medical

## 2024-02-06 VITALS — BP 100/82 | HR 52 | Temp 97.7°F | Ht 64.0 in | Wt 124.0 lb

## 2024-02-06 DIAGNOSIS — H6993 Unspecified Eustachian tube disorder, bilateral: Secondary | ICD-10-CM

## 2024-02-06 DIAGNOSIS — J069 Acute upper respiratory infection, unspecified: Secondary | ICD-10-CM

## 2024-02-06 DIAGNOSIS — H1033 Unspecified acute conjunctivitis, bilateral: Secondary | ICD-10-CM

## 2024-02-06 MED ORDER — OFLOXACIN 0.3 % OP SOLN: 1.0000 [drp] | OPHTHALMIC | 0 refills | Status: AC

## 2024-02-06 NOTE — Progress Notes (Unsigned)
 [facility] [address] [place], [place] [address]   Office Visit Note  Patient Name: Tonya Carrillo Date of Birth [id number]  Medical Record number [id number]  Date of Service: 02/06/2024  Chief Complaint  Patient presents with  . Acute Visit    Patient states she woke up at 2 AM with bilat eye discharge and both eyes are pink. She has an occasional film over her eyes. She used ofloxacin  eye drops that she had from a previous eye infection. She also reports having hearing loss and feeling like there is cotton in her left ear for the past 2 months and would like to have it looked at as well.     HPI Discussed the use of AI scribe software for clinical note transcription with the patient, who gave verbal consent to proceed.  History of Present Illness Tonya Carrillo is a 43 year old female who presents with eye discharge and sinus congestion.  Ocular discharge and conjunctival erythema - Awoke at 2:00 AM with sticky sensation in both eyes and discharge, more pronounced in the left eye - Discharge has improved after use of eye drops, but persistent film and redness remain - History of frequent conjunctivitis, particularly when her son started daycare - Wears monthly contact lenses, changes them regularly, and does not sleep in them  Aural symptoms and eustachian tube dysfunction - Intermittent hearing loss and clicking sound in ears since mid to late 2024-01-07 - Symptoms more pronounced on the left side, especially with recent sinus issues - Clicking occurs with swallowing or movement - No tinnitus - Symptoms have persisted for 6 to 8 weeks  Upper respiratory symptoms and sinus congestion - Cold symptoms began on Halloween, including sore throat, fatigue, and nasal discharge - Initial improvement with Flonase for 3 to 5 days - Resurgence of symptoms last Friday with significant pain around the eyes, increased nasal discharge, and dry cough - Pain  improved with Afrin and Claritin - Intermittent nasal discharge persists, described as white, yellowy, or clear yellowy; no dark or green discharge - No fever, chills, or significant sore throat - Increased fatigue, attributed to the time of the semester  Allergic rhinitis and medication intolerance - History of seasonal allergies, typically in the spring, but no symptoms this past spring - Uses Vicks VapoRub for chest congestion relief - Avoids Sudafed due to adverse reactions     Current Medication:  Outpatient Encounter Medications as of 02/06/2024  Medication Sig  . albuterol  (VENTOLIN  HFA) 108 (90 Base) MCG/ACT inhaler Inhale 2 puffs into the lungs every 6 (six) hours as needed for wheezing or shortness of breath.  . ALPRAZolam  (XANAX ) 0.25 MG tablet Take 1 tablet (0.25 mg total) by mouth daily as needed for anxiety.  SABRA BIOTIN PO Take by mouth daily.  . Coenzyme Q10 200 MG capsule Take by mouth daily.  SABRA ELDERBERRY PO Take by mouth daily.  . Multiple Vitamins-Minerals (MULTIVITAMIN WITH MINERALS) tablet Take 1 tablet by mouth daily.  . norgestimate-ethinyl estradiol  (ORTHO-CYCLEN) 0.25-35 MG-MCG tablet Take 1 tablet by mouth daily.  . TURMERIC PO Take by mouth daily.  . [DISCONTINUED] doxycycline (VIBRA-TABS) 100 MG tablet Take 1 tablet (100 mg total) by mouth 2 (two) times daily.   No facility-administered encounter medications on file as of 02/06/2024.      Medical History: Past Medical History:  Diagnosis Date  . Allergy 2010   seasonal allergies  . Anxiety 2005   panic attacks  .  Asthma   . GERD (gastroesophageal reflux disease)   . History of chicken pox   . Thyroid  disease 2024   hypothyroidism  . UTI (lower urinary tract infection)      Vital Signs: BP 100/82   Pulse (!) 52   Temp 97.7 F (36.5 C)   Ht 5' 4 (1.626 m)   Wt 124 lb (56.2 kg)   SpO2 100%   BMI 21.28 kg/m    Review of Systems  Physical Exam Vitals reviewed.  Constitutional:       General: She is not in acute distress.    Comments: Tired appearing  HENT:     Head: Normocephalic.     Right Ear: Ear canal and external ear normal.     Left Ear: Ear canal and external ear normal.     Ears:     Comments: TMs dull bilaterally    Nose: Mucosal edema and congestion present. No rhinorrhea.     Right Turbinates: Swollen.     Left Turbinates: Swollen.     Right Sinus: No maxillary sinus tenderness or frontal sinus tenderness.     Left Sinus: No maxillary sinus tenderness or frontal sinus tenderness.     Comments: Mild erythema to nasal mucosa    Mouth/Throat:     Mouth: Mucous membranes are moist. No oral lesions.     Pharynx: Posterior oropharyngeal erythema (slight) present. No pharyngeal swelling.     Tonsils: No tonsillar exudate.  Eyes:     Pupils: Pupils are equal, round, and reactive to light.     Comments: Slight erythema to upper/lower eye lids bilaterally. No swelling to eye lids. Small dry yellow/green crust to eyelashes.  Conjunctivae moderately erythematous bilaterally. Sclerae injected bilaterally.  Cardiovascular:     Rate and Rhythm: Regular rhythm. Bradycardia present.     Heart sounds: No murmur heard.    No friction rub. No gallop.  Pulmonary:     Effort: Pulmonary effort is normal.     Breath sounds: Normal breath sounds. No wheezing, rhonchi or rales.  Musculoskeletal:     Cervical back: Neck supple. No rigidity.  Lymphadenopathy:     Cervical: Cervical adenopathy (small anterior nodes, nontender) present.  Neurological:     Mental Status: She is alert.       Assessment/Plan: 1. Acute conjunctivitis of both eyes, unspecified acute conjunctivitis type (Primary) Viral versus bacterial. Given use of contacts, will continue abx drops. - ofloxacin  (OCUFLOX ) 0.3 % ophthalmic solution; Place 1 drop into both eyes every 4 (four) hours for 7 days.  Dispense: 5 mL; Refill: 0  2. Acute upper respiratory infection Resume Flonase. Patient does not  tolerate Pseudoephedrine.   Advised to contact provider with new/worsening sx. Would consider abx.     General Counseling: taleah bellantoni understanding of the findings of todays visit and plan of treatment. she has been encouraged to call the office with any questions or concerns that should arise related to todays visit.    Time spent:*** Minutes    Joen Arts PA-C Physician Assistant

## 2024-02-07 NOTE — Patient Instructions (Addendum)
-  Use antibiotic eye drops as prescribed. -Apply cool compress (cold washcloth) over eyes for 10-20 minutes as needed for pain/swelling/discomfort. -Avoid touching your eyes. When you must touch them, wash your hands or use hand sanitizer afterwards. -Do not wear eye makeup until your symptoms fully resolve. Discard mascara or eye makeup worn recently. -Do not wear contact lenses until symptoms resolve. Consider discarding pair worn most recently. -Do not share towels/washcloths with others.  -Change/wash pillowcases after few days on antibiotic drops and if visibly dirty.  -Rest and stay well hydrated (by drinking water and other liquids).  -Continue Flonase/Fluticasone nasal spray, 2 sprays to each nostril once a day. -Take over-the-counter medicines (i.e. Tylenol or Ibuprofen) to help relieve your symptoms. You may consider a trial of over-the-counter  Phenylephrine (decongestant), only use if tolerable. -Send MyChart message to provider or call as needed for new/worsening symptoms (i.e. fever, darker or more persistent nasal drainage, increased sinus pain/pressure, ear pain) or if symptoms do not improve as discussed with recommended treatment over the next 3-5 days. We may need to consider additional medications.

## 2024-03-16 ENCOUNTER — Ambulatory Visit: Payer: Self-pay | Admitting: Adult Health

## 2024-03-16 DIAGNOSIS — L089 Local infection of the skin and subcutaneous tissue, unspecified: Secondary | ICD-10-CM

## 2024-03-16 MED ORDER — DOXYCYCLINE HYCLATE 100 MG PO TABS: 100.0000 mg | ORAL_TABLET | Freq: Every day | ORAL | 0 refills | Status: DC

## 2024-03-16 NOTE — Progress Notes (Signed)
 " Virtual Visit Consent   Tonya Carrillo, you are scheduled for a virtual visit with a  provider today. Just as with appointments in the office, your consent must be obtained to participate.   I need to obtain your verbal consent now. Are you willing to proceed with your visit today? Tonya Carrillo has provided verbal consent on 03/16/2024 for a virtual visit (video or telephone). Juliene JINNY Howells, NP  Date: 03/16/2024 7:38 AM  Virtual Visit via Video Note   I, Juliene JINNY Howells, connected with  Tonya Carrillo  ([id number], Oct 29, 1980) on 03/16/2024 at  8:00 AM EST by telephone and verified that I am speaking with the correct person using two identifiers.  Location: Patient: Virtual Visit Location Patient: Home Provider: Virtual Visit Location Provider: Office/Clinic   I discussed the limitations of evaluation and management by telemedicine and the availability of in person appointments. The patient expressed understanding and agreed to proceed.    History of Present Illness: Tonya Carrillo is a 43 y.o. and is being seen today for returning rash in groin.  She was seen in October for Staph that her child and husband both had.  She has a new boil type area in the left groin.  She noticed it days ago.  She used polysporin.  HPI: HPI  Problems:  Patient Active Problem List   Diagnosis Date Noted   Palpitations 12/31/2021   Cold intolerance 12/31/2021   Alopecia 12/31/2021   Pre-eclampsia, severe, delivered with postpartum condition 09/19/2020   Rh negative, antepartum 02/07/2020   Carrier of genetic disorder 02/07/2020   Family history of trisomy 13 10/03/2019   Exercise-induced asthma 08/22/2018   Situational anxiety 02/04/2015    Allergies: Allergies[1] Medications: Current Medications[2]  Observations/Objective: No labored breathing.  Speech is clear and coherent with logical content.  Patient is alert and oriented at baseline.    Assessment and  Plan: There are no diagnoses linked to this encounter. 1. Skin infection (Primary) Take doxycycline  as discussed, follow up as needed.  - doxycycline  (VIBRA -TABS) 100 MG tablet; Take 1 tablet (100 mg total) by mouth daily.  Dispense: 30 tablet; Refill: 0        Follow Up Instructions: I discussed the assessment and treatment plan with the patient. The patient was provided an opportunity to ask questions and all were answered. The patient agreed with the plan and demonstrated an understanding of the instructions.    The patient was advised to call back or seek an in-person evaluation if the symptoms worsen or if the condition fails to improve as anticipated.  Time:  I spent 15 minutes with the patient via telehealth technology discussing the above problems/concerns.    Juliene JINNY Howells, NP     [1]  Allergies Allergen Reactions   Omega 3-6-9 Fatty Acids [Omega-3 Fusion]     swelliing with throat and dry cough   Other     Almond had swelling in throat and mouth itching, no respiratory   Tylenol [Acetaminophen] Cough   Shellfish Allergy Rash  [2]  Current Outpatient Medications:    albuterol  (VENTOLIN  HFA) 108 (90 Base) MCG/ACT inhaler, Inhale 2 puffs into the lungs every 6 (six) hours as needed for wheezing or shortness of breath., Disp: 8.5 g, Rfl: 0   ALPRAZolam  (XANAX ) 0.25 MG tablet, Take 1 tablet (0.25 mg total) by mouth daily as needed for anxiety., Disp: 20 tablet, Rfl: 0   BIOTIN PO, Take by mouth daily., Disp: , Rfl:  Coenzyme Q10 200 MG capsule, Take by mouth daily., Disp: , Rfl:    ELDERBERRY PO, Take by mouth daily., Disp: , Rfl:    Multiple Vitamins-Minerals (MULTIVITAMIN WITH MINERALS) tablet, Take 1 tablet by mouth daily., Disp: , Rfl:    norgestimate-ethinyl estradiol  (ORTHO-CYCLEN) 0.25-35 MG-MCG tablet, Take 1 tablet by mouth daily., Disp: , Rfl:    TURMERIC PO, Take by mouth daily., Disp: , Rfl:   "

## 2024-04-11 ENCOUNTER — Encounter: Payer: Self-pay | Admitting: Internal Medicine

## 2024-04-11 ENCOUNTER — Other Ambulatory Visit: Payer: Self-pay

## 2024-04-11 ENCOUNTER — Ambulatory Visit (INDEPENDENT_AMBULATORY_CARE_PROVIDER_SITE_OTHER): Admitting: Internal Medicine

## 2024-04-11 VITALS — BP 110/82 | HR 64 | Temp 97.7°F | Resp 16 | Wt 131.0 lb

## 2024-04-11 DIAGNOSIS — Z8639 Personal history of other endocrine, nutritional and metabolic disease: Secondary | ICD-10-CM

## 2024-04-11 DIAGNOSIS — F419 Anxiety disorder, unspecified: Secondary | ICD-10-CM

## 2024-04-11 DIAGNOSIS — Z87898 Personal history of other specified conditions: Secondary | ICD-10-CM

## 2024-04-11 DIAGNOSIS — E559 Vitamin D deficiency, unspecified: Secondary | ICD-10-CM | POA: Diagnosis not present

## 2024-04-11 DIAGNOSIS — Z1211 Encounter for screening for malignant neoplasm of colon: Secondary | ICD-10-CM

## 2024-04-11 DIAGNOSIS — Z0000 Encounter for general adult medical examination without abnormal findings: Secondary | ICD-10-CM | POA: Diagnosis not present

## 2024-04-11 DIAGNOSIS — Z1231 Encounter for screening mammogram for malignant neoplasm of breast: Secondary | ICD-10-CM | POA: Diagnosis not present

## 2024-04-11 DIAGNOSIS — Z13220 Encounter for screening for lipoid disorders: Secondary | ICD-10-CM

## 2024-04-11 DIAGNOSIS — Z789 Other specified health status: Secondary | ICD-10-CM

## 2024-04-11 MED ORDER — ALPRAZOLAM 0.25 MG PO TABS: 0.2500 mg | ORAL_TABLET | Freq: Every day | ORAL | 0 refills | Status: AC | PRN

## 2024-04-11 NOTE — Progress Notes (Signed)
 Name: Tonya Carrillo   MRN: [id number]    DOB: 04-26-1980   Date:04/11/2024       Progress Note  Subjective  Chief Complaint  Chief Complaint  Patient presents with   Annual Exam    HPI  Patient presents for annual CPE.  Discussed the use of AI scribe software for clinical note transcription with the patient, who gave verbal consent to proceed.  History of Present Illness Tonya Carrillo is a 44 year old female who presents for an annual physical exam and concerns about bloating, fatigue, and prolonged menstrual periods.  She reports bloating and fatigue with a sensation of abdominal tightness. She wonders about a thyroid  cause. She denies pregnancy.  She takes Sprintec, which has regulated her cycles but her periods are now longer at 7 to 8 days, previously 3 to 4 days. She feels better on Sprintec than on a prior progesterone -heavy pill that caused marked fatigue.  She had postpartum preeclampsia and monitors her blood pressure at home. Readings are usually stable, but she notices elevations with stress.  She recently had a recurrent staph infection that responded to doxycycline  but recurred around Christmas and required an extended course. She finished doxycycline  one week ago and wonders if it contributes to her current symptoms. She had MRSA in 2003.  She is up to date on flu and tetanus vaccines and completed the initial COVID-19 series but has not received recent boosters. She is unsure about current COVID-19 booster recommendations.   Diet: Vegetarian Exercise: 7 days 60-90 minutes  Last Eye Exam: completed Last Dental Exam: completed  Flowsheet Row Office Visit from [facility]/11/20[facility]  AUDIT-C Score 4    Depression: Phq 9 is  negative    04/11/2024    8:11 AM 04/26/2023    9:12 AM 12/28/2022    1:46 PM 12/31/2021    2:57 PM 11/11/2020    2:46 PM  Depression screen PHQ 2/9  Decreased Interest 0 0 0 0 0  Down, Depressed,  Hopeless 0 0 0 0 0  PHQ - 2 Score 0 0 0 0 0  Altered sleeping   0    Tired, decreased energy   0    Change in appetite   0    Feeling bad or failure about yourself    0    Trouble concentrating   0    Moving slowly or fidgety/restless   0    Suicidal thoughts   0    PHQ-9 Score   0     Difficult doing work/chores   Not difficult at all       Data saved with a previous flowsheet row definition   Hypertension: BP Readings from Last 3 Encounters:  04/11/24 110/82  02/06/24 100/82  08/11/23 122/72   Obesity: Wt Readings from Last 3 Encounters:  04/11/24 131 lb (59.4 kg)  02/06/24 124 lb (56.2 kg)  08/11/23 124 lb (56.2 kg)   BMI Readings from Last 3 Encounters:  04/11/24 22.49 kg/m  02/06/24 21.28 kg/m  08/11/23 21.28 kg/m     Vaccines: UTD reviewed with the patient. Discussed Hepatitis B vaccines, will check titers.   Hep C Screening: completed STD testing and prevention (HIV/chl/gon/syphilis): None Intimate partner violence: negative screen  Sexual History :active Menstrual History/LMP/Abnormal Bleeding: 03/23/2024 Discussed importance of follow up if any post-menopausal bleeding: not applicable  Incontinence Symptoms: negative for symptoms   Breast cancer:  - Last Mammogram: 05/20/2023 [facility]1, ordered for  this year  Osteoporosis Prevention : Discussed high calcium and vitamin D  supplementation, weight bearing exercises Bone density :not applicable   Cervical cancer screening: up-to-date 1/25  Skin cancer: Discussed monitoring for atypical lesions  Colorectal cancer: Discussed starting screening at age 62 - patient agreeable for colonoscopy, referral placed to be scheduled   Lung cancer:  Low Dose CT Chest recommended if Age 73-80 years, 20 pack-year currently smoking OR have quit w/in 15years. Patient does not qualify for screen    Advanced Care Planning: A voluntary discussion about advance care planning including the explanation and discussion of advance  directives.  Discussed health care proxy and Living will, and the patient was able to identify a health care proxy.  Patient does not have a living will and power of attorney of health care   Patient Active Problem List   Diagnosis Date Noted   Palpitations 12/31/2021   Cold intolerance 12/31/2021   Alopecia 12/31/2021   Pre-eclampsia, severe, delivered with postpartum condition 09/19/2020   Rh negative, antepartum 02/07/2020   Carrier of genetic disorder 02/07/2020   Family history of trisomy 13 10/03/2019   Exercise-induced asthma 08/22/2018   Situational anxiety 02/04/2015    Past Surgical History:  Procedure Laterality Date   BREAST BIOPSY Right 05/19/2022   rt stereo, calcs, coil clip,BENIGN MAMMARY PARENCHYMA WITH DENSE STROMAL FIBROSIS, WITH FOCAL MICROCALCIFICATIONS AND CALCIUM OXALATE CRYSTALS. - NEGATIVE FOR ATYPICAL PROLIFERATIVE BREAST DISEASE.   BREAST BIOPSY Right 05/19/2022   MM RT BREAST BX W LOC DEV 1ST LESION IMAGE BX SPEC STEREO GUIDE 05/19/2022 [facility]   BREAST SURGERY  03/22/2002   cyst removed   NECK SURGERY  03/22/2005   cyst removed from left side of neck   TONSILLECTOMY      Family History  Problem Relation Age of Onset   Stroke Mother    Hypertension Mother    Hyperlipidemia Mother    Thyroid  disease Mother    Cancer Mother    Cancer Father        multiple myeloma   Breast cancer Maternal Grandmother 35   Cancer Maternal Grandmother        breast   Cancer Maternal Grandfather 67       colon    ALS Paternal Grandfather    Obesity Brother    Cancer Maternal Aunt     Social History   Socioeconomic History   Marital status: Married    Spouse name: Not on file   Number of children: 0   Years of education: Not on file   Highest education level: Doctorate  Occupational History   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    Employer: [employer]  Tobacco Use   Smoking status: Former    Current packs/day: 0.00    Average packs/day: 1 pack/day  for 4.0 years (4.0 ttl pk-yrs)    Types: Cigarettes    Quit date: 03/23/2003    Years since quitting: 21.0   Smokeless tobacco: Never  Vaping Use   Vaping status: Never Used  Substance and Sexual Activity   Alcohol use: Yes    Alcohol/week: 3.0 standard drinks of alcohol    Types: 2 Glasses of wine, 1 Cans of beer per week    Comment: wine on weekend   Drug use: No   Sexual activity: Yes    Birth control/protection: Condom  Other Topics Concern   Not on file  Social History Narrative   Diet Vegetarian.. Eats a lot of protein limits sweets  Regular exercise running, weights, yoga   Social Drivers of Health   Tobacco Use: Medium Risk (04/11/2024)   Patient History    Smoking Tobacco Use: Former    Smokeless Tobacco Use: Never    Passive Exposure: Not on file  Financial Resource Strain: Low Risk (04/07/2024)   Overall Financial Resource Strain (CARDIA)    Difficulty of Paying Living Expenses: Not hard at all  Food Insecurity: No Food Insecurity (04/11/2024)   Epic    Worried About Programme Researcher, Broadcasting/film/video in the Last Year: Never true    Ran Out of Food in the Last Year: Never true  Transportation Needs: No Transportation Needs (04/07/2024)   Epic    Lack of Transportation (Medical): No    Lack of Transportation (Non-Medical): No  Physical Activity: Sufficiently Active (04/07/2024)   Exercise Vital Sign    Days of Exercise per Week: 7 days    Minutes of Exercise per Session: 60 min  Stress: Stress Concern Present (04/07/2024)   [employer] of Occupational Health - Occupational Stress Questionnaire    Feeling of Stress: To some extent  Social Connections: Socially Isolated (04/07/2024)   Social Connection and Isolation Panel    Frequency of Communication with Friends and Family: Once a week    Frequency of Social Gatherings with Friends and Family: Once a week    Attends Religious Services: Never    Database Administrator or Organizations: No    Attends Museum/gallery Exhibitions Officer: Not on file    Marital Status: Married  Catering Manager Violence: Not At Risk (04/11/2024)   Epic    Fear of Current or Ex-Partner: No    Emotionally Abused: No    Physically Abused: No    Sexually Abused: No  Depression (PHQ2-9): Low Risk (04/11/2024)   Depression (PHQ2-9)    PHQ-2 Score: 0  Alcohol Screen: Low Risk (04/07/2024)   Alcohol Screen    Last Alcohol Screening Score (AUDIT): 4  Housing: Unknown (04/11/2024)   Epic    Unable to Pay for Housing in the Last Year: No    Number of Times Moved in the Last Year: Not on file    Homeless in the Last Year: No  Utilities: Not At Risk (04/11/2024)   Epic    Threatened with loss of utilities: No  Health Literacy: Adequate Health Literacy (04/11/2024)   B1300 Health Literacy    Frequency of need for help with medical instructions: Never    Current Medications[1]  Allergies[2]   Review of Systems  All other systems reviewed and are negative.    Objective  Vitals:   04/11/24 0812  BP: 110/82  Pulse: 64  Resp: 16  Temp: 97.7 F (36.5 C)  TempSrc: Oral  SpO2: 99%  Weight: 131 lb (59.4 kg)    Body mass index is 22.49 kg/m.  Physical Exam Constitutional:      Appearance: Normal appearance.  HENT:     Head: Normocephalic and atraumatic.     Mouth/Throat:     Mouth: Mucous membranes are moist.     Pharynx: Oropharynx is clear.  Eyes:     Extraocular Movements: Extraocular movements intact.     Conjunctiva/sclera: Conjunctivae normal.     Pupils: Pupils are equal, round, and reactive to light.  Neck:     Comments: No thyromegaly Cardiovascular:     Rate and Rhythm: Normal rate and regular rhythm.  Pulmonary:     Effort: Pulmonary effort is normal.  Breath sounds: Normal breath sounds.  Musculoskeletal:     Cervical back: No tenderness.     Right lower leg: No edema.     Left lower leg: No edema.  Lymphadenopathy:     Cervical: No cervical adenopathy.  Skin:    General: Skin is  warm and dry.  Neurological:     General: No focal deficit present.     Mental Status: She is alert. Mental status is at baseline.  Psychiatric:        Mood and Affect: Mood normal.        Behavior: Behavior normal.     Last CBC Lab Results  Component Value Date   WBC 7.2 04/26/2023   HGB 13.1 04/26/2023   HCT 39.5 04/26/2023   MCV 91.2 04/26/2023   MCH 30.3 04/26/2023   RDW 12.1 04/26/2023   PLT 392 04/26/2023   Last metabolic panel Lab Results  Component Value Date   GLUCOSE 88 04/26/2023   NA 139 04/26/2023   K 4.2 04/26/2023   CL 100 04/26/2023   CO2 30 04/26/2023   BUN 10 04/26/2023   CREATININE 0.76 04/26/2023   EGFR 100 04/26/2023   CALCIUM 9.1 04/26/2023   PROT 6.9 04/26/2023   ALBUMIN 4.4 12/31/2021   BILITOT 0.4 04/26/2023   ALKPHOS 62 12/31/2021   AST 37 (H) 04/26/2023   ALT 25 04/26/2023   Last lipids Lab Results  Component Value Date   CHOL 168 04/26/2023   HDL 78 04/26/2023   LDLCALC 77 04/26/2023   TRIG 57 04/26/2023   CHOLHDL 2.2 04/26/2023   Last hemoglobin A1c Lab Results  Component Value Date   HGBA1C 5.4 04/26/2023   Last thyroid  functions Lab Results  Component Value Date   TSH 2.35 04/26/2023   T4TOTAL 8.3 04/26/2023   FREET4 0.73 12/31/2021   Last vitamin D  Lab Results  Component Value Date   VD25OH 81 04/26/2023   Last vitamin B12 and Folate Lab Results  Component Value Date   VITAMINB12 291 09/01/2011      Assessment & Plan  Assessment & Plan Annual Physical/Annual preventive health evaluation Routine evaluation conducted. Labs from last year satisfactory. Discussed health maintenance, screenings, and vaccinations. - Ordered routine labs. - Performed physical examination.  Colorectal cancer screening Discussed colonoscopy as gold standard. Explained procedure and Cologuard as alternative with limitations. Emphasized screening importance starting at age 70. - Placed referral for colonoscopy. - Advised  scheduling colonoscopy after turning 44.  Breast cancer screening Mammogram ordered last year, scheduling delayed. Discussed importance of timely screening for insurance coverage. - Advised contacting mammogram scheduling to expedite appointment.  Cervical cancer screening Pap smear conducted last year. No immediate concerns noted.  Immunization review and management Discussed hepatitis B vaccination status and COVID-19 vaccination options. She decided against further COVID-19 vaccinations. - Ordered hepatitis B surface antibody titer. - Discussed COVID-19 vaccination options.  Thyroid  function evaluation Reports bloating and slowness, possibly thyroid -related. Plan to evaluate with labs. - Ordered thyroid  function tests.  Menstrual irregularity on oral contraceptive Reports prolonged periods on Sprintec. Discussed continuous use to reduce frequency. Advised consulting gynecologist before changes. - Advised consulting gynecologist regarding continuous oral contraceptive use.  History of postpartum preeclampsia Discussed blood pressure monitoring due to history. Advised on hypertension signs and when to seek attention. - Advised weekly blood pressure monitoring. - Instructed to seek medical attention if experiencing symptoms of hypertension.  Recurrent skin infection Recent doxycycline  treatment. Discussed potential GI side effects and advised monitoring  for resolution. - Continue to monitor for resolution of GI side effects.  Anxiety disorder Fear of flying, prescribe Xanax  0.25 mg 20 tablets yearly - due for refill.    - CBC w/Diff/Platelet - Comprehensive Metabolic Panel (CMET) - Lipid Profile - HgB A1c - Thyroid  Panel With TSH - Hepatitis B Surface AntiBODY - Ambulatory referral to Gastroenterology - ALPRAZolam  (XANAX ) 0.25 MG tablet; Take 1 tablet (0.25 mg total) by mouth daily as needed for anxiety.  Dispense: 20 tablet; Refill: 0 - Vitamin D  (25 hydroxy) - MM 3D  SCREENING MAMMOGRAM BILATERAL BREAST; Future - Ambulatory referral to Gastroenterology - Hepatitis B Surface AntiBODY   -USPSTF grade A and B recommendations reviewed with patient; age-appropriate recommendations, preventive care, screening tests, etc discussed and encouraged; healthy living encouraged; see AVS for patient education given to patient -Discussed importance of 150 minutes of physical activity weekly, eat two servings of fish weekly, eat one serving of tree nuts ( cashews, pistachios, pecans, almonds.SABRA) every other day, eat 6 servings of fruit/vegetables daily and drink plenty of water and avoid sweet beverages.   -Reviewed Health Maintenance: Yes.       [1]  Current Outpatient Medications:    albuterol  (VENTOLIN  HFA) 108 (90 Base) MCG/ACT inhaler, Inhale 2 puffs into the lungs every 6 (six) hours as needed for wheezing or shortness of breath., Disp: 8.5 g, Rfl: 0   ALPRAZolam  (XANAX ) 0.25 MG tablet, Take 1 tablet (0.25 mg total) by mouth daily as needed for anxiety., Disp: 20 tablet, Rfl: 0   BIOTIN PO, Take by mouth daily., Disp: , Rfl:    Coenzyme Q10 200 MG capsule, Take by mouth daily., Disp: , Rfl:    ELDERBERRY PO, Take by mouth daily., Disp: , Rfl:    Multiple Vitamins-Minerals (MULTIVITAMIN WITH MINERALS) tablet, Take 1 tablet by mouth daily., Disp: , Rfl:    norgestimate-ethinyl estradiol  (ORTHO-CYCLEN) 0.25-35 MG-MCG tablet, Take 1 tablet by mouth daily., Disp: , Rfl:    TURMERIC PO, Take by mouth daily., Disp: , Rfl:    doxycycline  (VIBRA -TABS) 100 MG tablet, Take 1 tablet (100 mg total) by mouth daily. (Patient not taking: Reported on 04/11/2024), Disp: 30 tablet, Rfl: 0 [2]  Allergies Allergen Reactions   Omega 3-6-9 Fatty Acids [Omega-3 Fusion]     swelliing with throat and dry cough   Other     Almond had swelling in throat and mouth itching, no respiratory   Tylenol [Acetaminophen] Cough   Shellfish Allergy Rash

## 2024-04-12 ENCOUNTER — Ambulatory Visit: Payer: Self-pay | Admitting: Internal Medicine

## 2024-04-12 LAB — CBC WITH DIFFERENTIAL/PLATELET
Absolute Lymphocytes: 2018 {cells}/uL (ref 850–3900)
Absolute Monocytes: 354 {cells}/uL (ref 200–950)
Basophils Absolute: 41 {cells}/uL (ref 0–200)
Basophils Relative: 0.7 %
Eosinophils Absolute: 100 {cells}/uL (ref 15–500)
Eosinophils Relative: 1.7 %
HCT: 39 % (ref 35.9–46.0)
Hemoglobin: 13.3 g/dL (ref 11.7–15.5)
MCH: 31.9 pg (ref 27.0–33.0)
MCHC: 34.1 g/dL (ref 31.6–35.4)
MCV: 93.5 fL (ref 81.4–101.7)
MPV: 10.2 fL (ref 7.5–12.5)
Monocytes Relative: 6 %
Neutro Abs: 3387 {cells}/uL (ref 1500–7800)
Neutrophils Relative %: 57.4 %
Platelets: 348 Thousand/uL (ref 140–400)
RBC: 4.17 Million/uL (ref 3.80–5.10)
RDW: 12.3 % (ref 11.0–15.0)
Total Lymphocyte: 34.2 %
WBC: 5.9 Thousand/uL (ref 3.8–10.8)

## 2024-04-12 LAB — THYROID PANEL WITH TSH
Free Thyroxine Index: 2.8 (ref 1.4–3.8)
T4, Total: 10.3 ug/dL (ref 5.1–11.9)
TSH: 2.72 m[IU]/L
TSH: 27 m[IU]/L (ref 22–35)

## 2024-04-12 LAB — COMPREHENSIVE METABOLIC PANEL WITH GFR
AG Ratio: 1.9 (calc) (ref 1.0–2.5)
ALT: 30 U/L — ABNORMAL HIGH (ref 6–29)
AST: 38 U/L — ABNORMAL HIGH (ref 10–30)
Albumin: 4.5 g/dL (ref 3.6–5.1)
Alkaline phosphatase (APISO): 43 U/L (ref 31–125)
BUN: 11 mg/dL (ref 7–25)
CO2: 30 mmol/L (ref 20–32)
Calcium: 9.2 mg/dL (ref 8.6–10.2)
Chloride: 100 mmol/L (ref 98–110)
Creat: 0.91 mg/dL (ref 0.50–0.99)
Globulin: 2.4 g/dL (ref 1.9–3.7)
Glucose, Bld: 86 mg/dL (ref 65–99)
Potassium: 3.9 mmol/L (ref 3.5–5.3)
Sodium: 138 mmol/L (ref 135–146)
Total Bilirubin: 0.6 mg/dL (ref 0.2–1.2)
Total Protein: 6.9 g/dL (ref 6.1–8.1)
eGFR: 80 mL/min/1.73m2

## 2024-04-12 LAB — HEMOGLOBIN A1C
Hgb A1c MFr Bld: 5.2 %
Mean Plasma Glucose: 103 mg/dL
eAG (mmol/L): 5.7 mmol/L

## 2024-04-12 LAB — LIPID PANEL
Cholesterol: 216 mg/dL — ABNORMAL HIGH
HDL: 108 mg/dL
LDL Cholesterol (Calc): 89 mg/dL
Non-HDL Cholesterol (Calc): 108 mg/dL
Total CHOL/HDL Ratio: 2 (calc)
Triglycerides: 93 mg/dL

## 2024-04-12 LAB — HEPATITIS B SURFACE ANTIBODY,QUALITATIVE: Hep B S Ab: NONREACTIVE

## 2024-04-12 LAB — VITAMIN D 25 HYDROXY (VIT D DEFICIENCY, FRACTURES): Vit D, 25-Hydroxy: 77 ng/mL (ref 30–100)

## 2024-04-19 ENCOUNTER — Ambulatory Visit: Payer: BC Managed Care – PPO | Admitting: Internal Medicine

## 2024-04-20 ENCOUNTER — Encounter: Payer: Self-pay | Admitting: Family Medicine

## 2024-04-20 ENCOUNTER — Ambulatory Visit (INDEPENDENT_AMBULATORY_CARE_PROVIDER_SITE_OTHER): Admitting: Family Medicine

## 2024-04-20 ENCOUNTER — Other Ambulatory Visit: Payer: Self-pay | Admitting: Family Medicine

## 2024-04-20 VITALS — BP 130/70 | HR 80 | Resp 16 | Ht 64.0 in | Wt 131.0 lb

## 2024-04-20 DIAGNOSIS — L089 Local infection of the skin and subcutaneous tissue, unspecified: Secondary | ICD-10-CM | POA: Diagnosis not present

## 2024-04-20 MED ORDER — MUPIROCIN 2 % EX OINT: 1.0000 | TOPICAL_OINTMENT | Freq: Two times a day (BID) | CUTANEOUS | 0 refills | Status: AC

## 2024-04-20 NOTE — Progress Notes (Signed)
 "  Acute Office Visit  Subjective:     Patient ID: Tonya Carrillo, female    DOB: 1980-04-23, 44 y.o.   MRN: [id number]  Chief Complaint  Patient presents with   Rash    Spot on inner thigh and on buttocks. Has had before- would like cultured.    Rash Pertinent negatives include no fever.   Patient is in today for concerns of recurrent skin infections. She is a new patient to me. She is a pleasant 44 year old female who voices over the last few months she has been dealing with recurrent skin infections. She describes the area to start as a pimple and then over time it increases in size and begins to drain. Most recently treated on 03/16/24, reporting she completed 20 days of Doxycycline . Prior to December she was treated in 12/2023 for similar appearing boil that was diagnosed as folliculitis. She voices past MRSA infection 20 years ago requiring hospitalization.   She voices this new place developed this Monday on the right inner buttock and since Monday has increased in size. She describes the area to drain often clear or yellow tinged fluid, reporting mild to moderate drainage amount. Denies fever or chills. She voices sometimes after removing a Band-Aid from the area she will have a rash where the bandage had been. She is frustrated due to recurrent infections and is hoping to get a answer for cause, requesting culture. Which I agree that culture is needed.   On the right buttock there is a flat, erythematous lesion without current drainage. Localized erythema. Non-edematous. She does endorse discomfort with palpation and when other pressure is applied such as when sitting.   Review of Systems  Constitutional:  Negative for chills and fever.  Skin:  Positive for rash.        Objective:    BP 130/70   Pulse 80   Resp 16   Ht 5' 4 (1.626 m)   Wt 131 lb (59.4 kg)   SpO2 100%   BMI 22.49 kg/m      Physical Exam Constitutional:      General: She is not in acute  distress.    Appearance: Normal appearance. She is well-developed. She is not toxic-appearing.  Cardiovascular:     Rate and Rhythm: Normal rate and regular rhythm.     Heart sounds: Normal heart sounds.  Pulmonary:     Effort: Pulmonary effort is normal.     Breath sounds: Normal breath sounds.  Skin:    General: Skin is warm and dry.     Comments: Lesion on right buttock as per HPI  Neurological:     Mental Status: She is alert and oriented to person, place, and time.  Psychiatric:        Mood and Affect: Mood normal.        Behavior: Behavior normal.        Assessment & Plan:   Assessment & Plan Local skin infection Concern of local skin infection affecting right buttock. Patient reports hx of MRSA. See HPI for details.   Flat, open, erythematous lesion without drainage at time of exam. Localized erythema, non-edematous. Pain with palpation.   -Wound culture completed, pending -HSV culture completed, pending  -Cleanse with warm water and antibacterial soap, pat dry, and apply topical Mupirocin  BID  -Return precautions advised.  -Return in 1 month for f/u with PCP Orders:   Wound culture   Herpes simplex virus culture   mupirocin  ointment (BACTROBAN )  2 %; Apply 1 Application topically 2 (two) times daily.     Return in about 1 month (around 05/19/2024) for skin check follow up with PCP .  LAYMON LOISE CORE, FNP  "

## 2024-04-23 ENCOUNTER — Ambulatory Visit: Payer: Self-pay | Admitting: Family Medicine

## 2024-04-23 DIAGNOSIS — L089 Local infection of the skin and subcutaneous tissue, unspecified: Secondary | ICD-10-CM

## 2024-04-23 LAB — AEROBIC CULTURE
MICRO NUMBER:: 17533209
SPECIMEN QUALITY:: ADEQUATE

## 2024-04-24 MED ORDER — SULFAMETHOXAZOLE-TRIMETHOPRIM 800-160 MG PO TABS: 1.0000 | ORAL_TABLET | Freq: Two times a day (BID) | ORAL | 0 refills | Status: AC

## 2024-04-24 MED ORDER — MUPIROCIN 2 % EX OINT: 1.0000 | TOPICAL_OINTMENT | Freq: Two times a day (BID) | CUTANEOUS | 0 refills | Status: AC

## 2024-05-21 ENCOUNTER — Encounter

## 2024-05-23 ENCOUNTER — Ambulatory Visit: Admitting: Internal Medicine

## 2025-04-12 ENCOUNTER — Encounter: Admitting: Internal Medicine
